# Patient Record
Sex: Male | Born: 1935 | Race: White | Hispanic: No | Marital: Married
Health system: Southern US, Community
[De-identification: ages and names within clinical notes are randomized; demographics above are authoritative.]

---

## 2003-07-03 ENCOUNTER — Other Ambulatory Visit: Payer: Self-pay

## 2004-06-17 ENCOUNTER — Other Ambulatory Visit: Payer: Self-pay

## 2004-06-17 ENCOUNTER — Inpatient Hospital Stay: Payer: Self-pay | Admitting: General Practice

## 2005-11-30 ENCOUNTER — Ambulatory Visit: Payer: Self-pay | Admitting: Gastroenterology

## 2007-06-07 ENCOUNTER — Ambulatory Visit: Payer: Self-pay | Admitting: Internal Medicine

## 2008-07-18 ENCOUNTER — Ambulatory Visit: Payer: Self-pay | Admitting: Rheumatology

## 2008-09-13 ENCOUNTER — Ambulatory Visit: Payer: Self-pay | Admitting: Internal Medicine

## 2008-09-27 ENCOUNTER — Encounter: Payer: Self-pay | Admitting: Neurology

## 2008-10-17 ENCOUNTER — Encounter: Payer: Self-pay | Admitting: Neurology

## 2009-05-16 ENCOUNTER — Emergency Department: Payer: Self-pay | Admitting: Emergency Medicine

## 2009-12-06 ENCOUNTER — Emergency Department: Payer: Self-pay | Admitting: Emergency Medicine

## 2009-12-10 ENCOUNTER — Inpatient Hospital Stay: Payer: Self-pay | Admitting: Internal Medicine

## 2009-12-14 ENCOUNTER — Ambulatory Visit: Payer: Self-pay | Admitting: Family Medicine

## 2010-02-25 ENCOUNTER — Inpatient Hospital Stay: Payer: Self-pay | Admitting: Internal Medicine

## 2010-06-09 ENCOUNTER — Emergency Department: Payer: Self-pay | Admitting: Emergency Medicine

## 2010-06-23 ENCOUNTER — Inpatient Hospital Stay: Payer: Self-pay | Admitting: Internal Medicine

## 2010-06-26 DIAGNOSIS — R7989 Other specified abnormal findings of blood chemistry: Secondary | ICD-10-CM

## 2010-07-02 DIAGNOSIS — F0150 Vascular dementia without behavioral disturbance: Secondary | ICD-10-CM

## 2010-07-02 DIAGNOSIS — G20 Parkinson's disease: Secondary | ICD-10-CM

## 2010-07-02 DIAGNOSIS — I2510 Atherosclerotic heart disease of native coronary artery without angina pectoris: Secondary | ICD-10-CM

## 2010-07-02 DIAGNOSIS — I6990 Unspecified sequelae of unspecified cerebrovascular disease: Secondary | ICD-10-CM

## 2010-07-28 DIAGNOSIS — G20 Parkinson's disease: Secondary | ICD-10-CM

## 2010-07-28 DIAGNOSIS — I6990 Unspecified sequelae of unspecified cerebrovascular disease: Secondary | ICD-10-CM

## 2010-07-28 DIAGNOSIS — F068 Other specified mental disorders due to known physiological condition: Secondary | ICD-10-CM

## 2010-07-28 DIAGNOSIS — I10 Essential (primary) hypertension: Secondary | ICD-10-CM

## 2010-08-18 DIAGNOSIS — I6990 Unspecified sequelae of unspecified cerebrovascular disease: Secondary | ICD-10-CM

## 2010-08-18 DIAGNOSIS — I10 Essential (primary) hypertension: Secondary | ICD-10-CM

## 2010-08-18 DIAGNOSIS — L570 Actinic keratosis: Secondary | ICD-10-CM

## 2010-08-18 DIAGNOSIS — F068 Other specified mental disorders due to known physiological condition: Secondary | ICD-10-CM

## 2010-08-18 DIAGNOSIS — G20 Parkinson's disease: Secondary | ICD-10-CM

## 2010-09-17 ENCOUNTER — Emergency Department: Payer: Self-pay | Admitting: Emergency Medicine

## 2010-09-18 ENCOUNTER — Telehealth: Payer: Self-pay | Admitting: *Deleted

## 2010-09-18 NOTE — Telephone Encounter (Signed)
They need to get all this information from the family I didn't oppose him leaving but thought it was not a great idea I will not do further work on this issue unless he reestablishes with me They will need to get any information they need from the family or his physician

## 2010-09-18 NOTE — Telephone Encounter (Signed)
He is no longer under my care since leaving [facility] They should request assistance from his current physician

## 2010-09-18 NOTE — Telephone Encounter (Signed)
Spoke with nurse and she states that because pt was under Dr.Letvak's care in [facility] then he must write the order. I advised her that he would need to get the order from his current physician, per nurse she doesn't know who his current physician is, and she asked me to find out for her, I advised she would need to call [facility].

## 2010-09-18 NOTE — Telephone Encounter (Signed)
Home medical supply rep states that medicare denied pt's request for a hospital bed and she is asking if you will help with the appeal to them for coverage.  She is asking if you can write a letter for the patient explaining that he does need it.  Please advise.

## 2010-09-21 NOTE — Telephone Encounter (Signed)
Left message on machine with results.

## 2010-12-01 ENCOUNTER — Ambulatory Visit: Payer: Self-pay | Admitting: Internal Medicine

## 2011-08-01 ENCOUNTER — Inpatient Hospital Stay: Payer: Self-pay | Admitting: Internal Medicine

## 2011-08-01 LAB — URINALYSIS, COMPLETE
Bilirubin,UR: NEGATIVE
Blood: NEGATIVE
Glucose,UR: NEGATIVE mg/dL (ref 0–75)
Ketone: NEGATIVE
Leukocyte Esterase: NEGATIVE
Nitrite: NEGATIVE
Protein: NEGATIVE
RBC,UR: NONE SEEN /HPF (ref 0–5)

## 2011-08-01 LAB — COMPREHENSIVE METABOLIC PANEL
Albumin: 3.7 g/dL (ref 3.4–5.0)
Alkaline Phosphatase: 53 U/L (ref 50–136)
BUN: 45 mg/dL — ABNORMAL HIGH (ref 7–18)
Bilirubin,Total: 0.5 mg/dL (ref 0.2–1.0)
Calcium, Total: 8.4 mg/dL — ABNORMAL LOW (ref 8.5–10.1)
Chloride: 109 mmol/L — ABNORMAL HIGH (ref 98–107)
Co2: 24 mmol/L (ref 21–32)
Creatinine: 1.55 mg/dL — ABNORMAL HIGH (ref 0.60–1.30)
Glucose: 148 mg/dL — ABNORMAL HIGH (ref 65–99)
Osmolality: 299 (ref 275–301)
SGOT(AST): 25 U/L (ref 15–37)
Sodium: 143 mmol/L (ref 136–145)

## 2011-08-01 LAB — CBC WITH DIFFERENTIAL/PLATELET
Basophil #: 0.1 10*3/uL (ref 0.0–0.1)
Basophil %: 0.3 %
Eosinophil #: 0 10*3/uL (ref 0.0–0.7)
HCT: 42.2 % (ref 40.0–52.0)
MCV: 100 fL (ref 80–100)
Monocyte #: 1.3 x10 3/mm — ABNORMAL HIGH (ref 0.2–1.0)
Monocyte %: 8.1 %
Neutrophil %: 90.2 %
RDW: 16.2 % — ABNORMAL HIGH (ref 11.5–14.5)
WBC: 15.7 10*3/uL — ABNORMAL HIGH (ref 3.8–10.6)

## 2011-08-01 LAB — PROTIME-INR: Prothrombin Time: 12.7 secs (ref 11.5–14.7)

## 2011-08-01 LAB — WBCS, STOOL

## 2011-08-01 LAB — CLOSTRIDIUM DIFFICILE BY PCR

## 2011-08-02 LAB — COMPREHENSIVE METABOLIC PANEL
Albumin: 2.6 g/dL — ABNORMAL LOW (ref 3.4–5.0)
Anion Gap: 11 (ref 7–16)
BUN: 33 mg/dL — ABNORMAL HIGH (ref 7–18)
Calcium, Total: 7.3 mg/dL — ABNORMAL LOW (ref 8.5–10.1)
Chloride: 120 mmol/L — ABNORMAL HIGH (ref 98–107)
Co2: 18 mmol/L — ABNORMAL LOW (ref 21–32)
Creatinine: 1.35 mg/dL — ABNORMAL HIGH (ref 0.60–1.30)
EGFR (African American): 55 — ABNORMAL LOW
Glucose: 78 mg/dL (ref 65–99)
Potassium: 3.6 mmol/L (ref 3.5–5.1)
SGPT (ALT): 13 U/L

## 2011-08-02 LAB — CBC WITH DIFFERENTIAL/PLATELET
Basophil #: 0 10*3/uL (ref 0.0–0.1)
Basophil %: 0.2 %
Eosinophil %: 0.1 %
HCT: 36 % — ABNORMAL LOW (ref 40.0–52.0)
HGB: 11.6 g/dL — ABNORMAL LOW (ref 13.0–18.0)
Lymphocyte %: 8.6 %
MCH: 32 pg (ref 26.0–34.0)
Monocyte #: 0.8 x10 3/mm (ref 0.2–1.0)
Monocyte %: 10.3 %
Neutrophil #: 6.5 10*3/uL (ref 1.4–6.5)
Neutrophil %: 80.8 %
Platelet: 186 10*3/uL (ref 150–440)
RBC: 3.63 10*6/uL — ABNORMAL LOW (ref 4.40–5.90)
RDW: 16 % — ABNORMAL HIGH (ref 11.5–14.5)

## 2011-08-03 LAB — STOOL CULTURE

## 2011-08-04 LAB — BASIC METABOLIC PANEL
BUN: 17 mg/dL (ref 7–18)
Calcium, Total: 7.5 mg/dL — ABNORMAL LOW (ref 8.5–10.1)
Chloride: 110 mmol/L — ABNORMAL HIGH (ref 98–107)
EGFR (African American): >60
EGFR (Non-African Amer.): >60
Glucose: 78 mg/dL (ref 65–99)
Osmolality: 280 (ref 275–301)
Potassium: 3.2 mmol/L — ABNORMAL LOW (ref 3.5–5.1)
Sodium: 140 mmol/L (ref 136–145)

## 2011-08-11 ENCOUNTER — Other Ambulatory Visit: Payer: Medicare Other

## 2011-09-18 ENCOUNTER — Ambulatory Visit: Payer: Self-pay | Admitting: Internal Medicine

## 2011-10-05 ENCOUNTER — Inpatient Hospital Stay: Payer: Self-pay | Admitting: Internal Medicine

## 2011-10-05 LAB — CBC WITH DIFFERENTIAL/PLATELET
Basophil #: 0 10*3/uL (ref 0.0–0.1)
Eosinophil #: 0 10*3/uL (ref 0.0–0.7)
HGB: 12.9 g/dL — ABNORMAL LOW (ref 13.0–18.0)
Lymphocyte #: 0.8 10*3/uL — ABNORMAL LOW (ref 1.0–3.6)
MCH: 31.6 pg (ref 26.0–34.0)
MCHC: 31.8 g/dL — ABNORMAL LOW (ref 32.0–36.0)
MCV: 99 fL (ref 80–100)
Neutrophil #: 8.5 10*3/uL — ABNORMAL HIGH (ref 1.4–6.5)
Neutrophil %: 85.3 %
Platelet: 231 10*3/uL (ref 150–440)
RBC: 4.09 10*6/uL — ABNORMAL LOW (ref 4.40–5.90)
RDW: 15.3 % — ABNORMAL HIGH (ref 11.5–14.5)
WBC: 10 10*3/uL (ref 3.8–10.6)

## 2011-10-05 LAB — COMPREHENSIVE METABOLIC PANEL
Albumin: 3.6 g/dL (ref 3.4–5.0)
Alkaline Phosphatase: 59 U/L (ref 50–136)
Anion Gap: 10 (ref 7–16)
Bilirubin,Total: 0.7 mg/dL (ref 0.2–1.0)
Calcium, Total: 8.9 mg/dL (ref 8.5–10.1)
Creatinine: 1.36 mg/dL — ABNORMAL HIGH (ref 0.60–1.30)
EGFR (African American): 55 — ABNORMAL LOW
Glucose: 114 mg/dL — ABNORMAL HIGH (ref 65–99)
Osmolality: 283 (ref 275–301)
Potassium: 4.2 mmol/L (ref 3.5–5.1)
SGOT(AST): 27 U/L (ref 15–37)
Sodium: 138 mmol/L (ref 136–145)
Total Protein: 7.4 g/dL (ref 6.4–8.2)

## 2011-10-05 LAB — PROTIME-INR: INR: 0.9

## 2011-10-05 LAB — TSH: Thyroid Stimulating Horm: 2.07 u[IU]/mL

## 2011-10-05 LAB — TROPONIN I: Troponin-I: <0.02 ng/mL

## 2011-10-06 LAB — CBC WITH DIFFERENTIAL/PLATELET
Basophil %: 0.5 %
Eosinophil #: 0.1 10*3/uL (ref 0.0–0.7)
HCT: 36.5 % — ABNORMAL LOW (ref 40.0–52.0)
HGB: 11.9 g/dL — ABNORMAL LOW (ref 13.0–18.0)
Lymphocyte #: 1.5 10*3/uL (ref 1.0–3.6)
Lymphocyte %: 17.7 %
MCH: 31.8 pg (ref 26.0–34.0)
MCHC: 32.5 g/dL (ref 32.0–36.0)
MCV: 98 fL (ref 80–100)
Monocyte #: 1.1 x10 3/mm — ABNORMAL HIGH (ref 0.2–1.0)
Monocyte %: 12 %
Neutrophil %: 69.1 %
Platelet: 214 10*3/uL (ref 150–440)
RBC: 3.73 10*6/uL — ABNORMAL LOW (ref 4.40–5.90)
RDW: 14.8 % — ABNORMAL HIGH (ref 11.5–14.5)

## 2011-10-06 LAB — BASIC METABOLIC PANEL
BUN: 25 mg/dL — ABNORMAL HIGH (ref 7–18)
Co2: 24 mmol/L (ref 21–32)
Creatinine: 1.16 mg/dL (ref 0.60–1.30)
EGFR (African American): >60
EGFR (Non-African Amer.): 58 — ABNORMAL LOW
Potassium: 3.7 mmol/L (ref 3.5–5.1)

## 2011-10-06 LAB — LIPID PANEL
Cholesterol: 179 mg/dL (ref 0–200)
HDL Cholesterol: 74 mg/dL — ABNORMAL HIGH (ref 40–60)
Ldl Cholesterol, Calc: 80 mg/dL (ref 0–100)
Triglycerides: 127 mg/dL (ref 0–200)

## 2011-10-06 LAB — TROPONIN I: Troponin-I: 0.02 ng/mL

## 2011-10-06 LAB — FOLATE: Folic Acid: 19 ng/mL (ref 3.1–100.0)

## 2011-10-07 LAB — BASIC METABOLIC PANEL
Anion Gap: 10 (ref 7–16)
BUN: 22 mg/dL — ABNORMAL HIGH (ref 7–18)
Calcium, Total: 8.8 mg/dL (ref 8.5–10.1)
Co2: 25 mmol/L (ref 21–32)
Creatinine: 1.15 mg/dL (ref 0.60–1.30)
EGFR (African American): >60
Glucose: 73 mg/dL (ref 65–99)
Osmolality: 283 (ref 275–301)
Sodium: 141 mmol/L (ref 136–145)

## 2011-10-07 LAB — CBC WITH DIFFERENTIAL/PLATELET
Basophil #: 0 10*3/uL (ref 0.0–0.1)
Eosinophil %: 0.5 %
HCT: 38.2 % — ABNORMAL LOW (ref 40.0–52.0)
HGB: 12.5 g/dL — ABNORMAL LOW (ref 13.0–18.0)
Lymphocyte #: 1.1 10*3/uL (ref 1.0–3.6)
Lymphocyte %: 12.7 %
MCV: 98 fL (ref 80–100)
Neutrophil #: 6.5 10*3/uL (ref 1.4–6.5)
Neutrophil %: 75.3 %
Platelet: 210 10*3/uL (ref 150–440)
RBC: 3.88 10*6/uL — ABNORMAL LOW (ref 4.40–5.90)
RDW: 15.2 % — ABNORMAL HIGH (ref 11.5–14.5)
WBC: 8.6 10*3/uL (ref 3.8–10.6)

## 2011-10-08 LAB — CBC WITH DIFFERENTIAL/PLATELET
Basophil #: 0 10*3/uL (ref 0.0–0.1)
Basophil %: 0.5 %
Eosinophil #: 0 10*3/uL (ref 0.0–0.7)
Eosinophil %: 0.4 %
HCT: 36.9 % — ABNORMAL LOW (ref 40.0–52.0)
HGB: 12.3 g/dL — ABNORMAL LOW (ref 13.0–18.0)
Lymphocyte #: 1.3 10*3/uL (ref 1.0–3.6)
MCH: 32.8 pg (ref 26.0–34.0)
MCHC: 33.4 g/dL (ref 32.0–36.0)
Monocyte #: 1 x10 3/mm (ref 0.2–1.0)
Monocyte %: 11.8 %
Neutrophil #: 6.4 10*3/uL (ref 1.4–6.5)
Neutrophil %: 72.4 %
Platelet: 210 10*3/uL (ref 150–440)
RBC: 3.75 10*6/uL — ABNORMAL LOW (ref 4.40–5.90)
RDW: 15.4 % — ABNORMAL HIGH (ref 11.5–14.5)

## 2011-10-08 LAB — BASIC METABOLIC PANEL
Anion Gap: 9 (ref 7–16)
BUN: 24 mg/dL — ABNORMAL HIGH (ref 7–18)
Calcium, Total: 8.6 mg/dL (ref 8.5–10.1)
Chloride: 107 mmol/L (ref 98–107)
Co2: 24 mmol/L (ref 21–32)
EGFR (Non-African Amer.): 46 — ABNORMAL LOW
Glucose: 86 mg/dL (ref 65–99)
Potassium: 3.8 mmol/L (ref 3.5–5.1)
Sodium: 140 mmol/L (ref 136–145)

## 2011-10-10 LAB — CBC WITH DIFFERENTIAL/PLATELET
Basophil #: 0.1 10*3/uL (ref 0.0–0.1)
Eosinophil #: 0.1 10*3/uL (ref 0.0–0.7)
HCT: 39.6 % — ABNORMAL LOW (ref 40.0–52.0)
HGB: 12.8 g/dL — ABNORMAL LOW (ref 13.0–18.0)
Lymphocyte #: 1.9 10*3/uL (ref 1.0–3.6)
MCH: 31.8 pg (ref 26.0–34.0)
MCHC: 32.2 g/dL (ref 32.0–36.0)
Monocyte #: 1.6 x10 3/mm — ABNORMAL HIGH (ref 0.2–1.0)
Monocyte %: 11.3 %
RBC: 4.01 10*6/uL — ABNORMAL LOW (ref 4.40–5.90)
RDW: 15.5 % — ABNORMAL HIGH (ref 11.5–14.5)
WBC: 14.5 10*3/uL — ABNORMAL HIGH (ref 3.8–10.6)

## 2011-10-10 LAB — BASIC METABOLIC PANEL
BUN: 31 mg/dL — ABNORMAL HIGH (ref 7–18)
Creatinine: 1.51 mg/dL — ABNORMAL HIGH (ref 0.60–1.30)
EGFR (African American): 48 — ABNORMAL LOW
Glucose: 81 mg/dL (ref 65–99)
Osmolality: 287 (ref 275–301)

## 2011-10-11 LAB — CBC WITH DIFFERENTIAL/PLATELET
Basophil #: 0 10*3/uL (ref 0.0–0.1)
Basophil %: 0.3 %
Eosinophil #: 0.1 10*3/uL (ref 0.0–0.7)
Eosinophil %: 1 %
HCT: 33.2 % — ABNORMAL LOW (ref 40.0–52.0)
HGB: 10.9 g/dL — ABNORMAL LOW (ref 13.0–18.0)
Lymphocyte %: 10.9 %
MCH: 32.3 pg (ref 26.0–34.0)
Monocyte #: 1.3 x10 3/mm — ABNORMAL HIGH (ref 0.2–1.0)
Neutrophil #: 9.1 10*3/uL — ABNORMAL HIGH (ref 1.4–6.5)
RBC: 3.37 10*6/uL — ABNORMAL LOW (ref 4.40–5.90)
RDW: 15.7 % — ABNORMAL HIGH (ref 11.5–14.5)
WBC: 11.8 10*3/uL — ABNORMAL HIGH (ref 3.8–10.6)

## 2011-10-11 LAB — BASIC METABOLIC PANEL
Anion Gap: 9 (ref 7–16)
BUN: 26 mg/dL — ABNORMAL HIGH (ref 7–18)
Co2: 22 mmol/L (ref 21–32)
Creatinine: 1.16 mg/dL (ref 0.60–1.30)
Glucose: 88 mg/dL (ref 65–99)

## 2011-10-12 LAB — CBC WITH DIFFERENTIAL/PLATELET
Basophil #: 0 10*3/uL (ref 0.0–0.1)
Basophil %: 0.4 %
HCT: 36.8 % — ABNORMAL LOW (ref 40.0–52.0)
HGB: 12.2 g/dL — ABNORMAL LOW (ref 13.0–18.0)
Lymphocyte #: 1.4 10*3/uL (ref 1.0–3.6)
Lymphocyte %: 13.9 %
Monocyte #: 1.2 x10 3/mm — ABNORMAL HIGH (ref 0.2–1.0)
Monocyte %: 12.4 %
Neutrophil %: 72.1 %
RBC: 3.75 10*6/uL — ABNORMAL LOW (ref 4.40–5.90)
RDW: 15.5 % — ABNORMAL HIGH (ref 11.5–14.5)

## 2011-10-12 LAB — BASIC METABOLIC PANEL
BUN: 23 mg/dL — ABNORMAL HIGH (ref 7–18)
Co2: 25 mmol/L (ref 21–32)
EGFR (African American): >60
Glucose: 79 mg/dL (ref 65–99)
Potassium: 3.7 mmol/L (ref 3.5–5.1)
Sodium: 141 mmol/L (ref 136–145)

## 2011-10-18 ENCOUNTER — Ambulatory Visit: Payer: Self-pay | Admitting: Internal Medicine

## 2011-11-20 ENCOUNTER — Emergency Department: Payer: Self-pay | Admitting: Emergency Medicine

## 2011-11-20 LAB — CBC WITH DIFFERENTIAL/PLATELET
Basophil #: 0 10*3/uL (ref 0.0–0.1)
Basophil %: 0.2 %
HCT: 30.4 % — ABNORMAL LOW (ref 40.0–52.0)
HGB: 10.3 g/dL — ABNORMAL LOW (ref 13.0–18.0)
Lymphocyte %: 6.9 %
MCHC: 33.7 g/dL (ref 32.0–36.0)
Monocyte %: 7.5 %
Neutrophil #: 10.5 10*3/uL — ABNORMAL HIGH (ref 1.4–6.5)
Neutrophil %: 85.1 %
Platelet: 200 10*3/uL (ref 150–440)
RBC: 3.09 10*6/uL — ABNORMAL LOW (ref 4.40–5.90)
WBC: 12.4 10*3/uL — ABNORMAL HIGH (ref 3.8–10.6)

## 2011-11-20 LAB — URINALYSIS, COMPLETE
Bilirubin,UR: NEGATIVE
Blood: NEGATIVE
Glucose,UR: NEGATIVE mg/dL (ref 0–75)
Ketone: NEGATIVE
Nitrite: NEGATIVE
Protein: 30
RBC,UR: 8 /HPF (ref 0–5)
Specific Gravity: 1.02 (ref 1.003–1.030)
WBC UR: 424 /HPF (ref 0–5)

## 2011-11-20 LAB — COMPREHENSIVE METABOLIC PANEL
Albumin: 2.5 g/dL — ABNORMAL LOW (ref 3.4–5.0)
Alkaline Phosphatase: 64 U/L (ref 50–136)
Anion Gap: 11 (ref 7–16)
BUN: 41 mg/dL — ABNORMAL HIGH (ref 7–18)
Calcium, Total: 8.1 mg/dL — ABNORMAL LOW (ref 8.5–10.1)
EGFR (Non-African Amer.): 45 — ABNORMAL LOW
Glucose: 110 mg/dL — ABNORMAL HIGH (ref 65–99)
SGOT(AST): 19 U/L (ref 15–37)
SGPT (ALT): 8 U/L — ABNORMAL LOW (ref 12–78)
Total Protein: 6 g/dL — ABNORMAL LOW (ref 6.4–8.2)

## 2011-11-20 LAB — SEDIMENTATION RATE: Erythrocyte Sed Rate: 61 mm/hr — ABNORMAL HIGH (ref 0–20)

## 2011-11-21 LAB — URINE CULTURE

## 2012-02-10 ENCOUNTER — Inpatient Hospital Stay: Payer: Self-pay

## 2012-02-10 LAB — CBC WITH DIFFERENTIAL/PLATELET
Eosinophil %: 0.3 %
HCT: 32.1 % — ABNORMAL LOW (ref 40.0–52.0)
HGB: 11 g/dL — ABNORMAL LOW (ref 13.0–18.0)
Lymphocyte %: 2.5 %
MCH: 33.2 pg (ref 26.0–34.0)
MCV: 97 fL (ref 80–100)
Monocyte #: 0.8 x10 3/mm (ref 0.2–1.0)
Monocyte %: 8.1 %
Neutrophil #: 8.7 10*3/uL — ABNORMAL HIGH (ref 1.4–6.5)
Neutrophil %: 88.8 %
Platelet: 213 10*3/uL (ref 150–440)
RBC: 3.31 10*6/uL — ABNORMAL LOW (ref 4.40–5.90)
WBC: 9.8 10*3/uL (ref 3.8–10.6)

## 2012-02-10 LAB — URINALYSIS, COMPLETE
Glucose,UR: NEGATIVE mg/dL (ref 0–75)
Nitrite: NEGATIVE
Ph: 5 (ref 4.5–8.0)
Protein: NEGATIVE
RBC,UR: 2 /HPF (ref 0–5)
WBC UR: 80 /HPF (ref 0–5)

## 2012-02-10 LAB — TROPONIN I: Troponin-I: 0.02 ng/mL

## 2012-02-10 LAB — COMPREHENSIVE METABOLIC PANEL
Alkaline Phosphatase: 68 U/L (ref 50–136)
Anion Gap: 10 (ref 7–16)
Chloride: 111 mmol/L — ABNORMAL HIGH (ref 98–107)
Creatinine: 1.33 mg/dL — ABNORMAL HIGH (ref 0.60–1.30)
EGFR (African American): 56 — ABNORMAL LOW
EGFR (Non-African Amer.): 49 — ABNORMAL LOW
Potassium: 4.2 mmol/L (ref 3.5–5.1)
SGOT(AST): 14 U/L — ABNORMAL LOW (ref 15–37)
SGPT (ALT): 17 U/L (ref 12–78)

## 2012-02-11 LAB — BASIC METABOLIC PANEL
Anion Gap: 11 (ref 7–16)
Calcium, Total: 7.9 mg/dL — ABNORMAL LOW (ref 8.5–10.1)
Chloride: 111 mmol/L — ABNORMAL HIGH (ref 98–107)
Co2: 20 mmol/L — ABNORMAL LOW (ref 21–32)
EGFR (African American): 50 — ABNORMAL LOW
EGFR (Non-African Amer.): 43 — ABNORMAL LOW
Glucose: 85 mg/dL (ref 65–99)
Osmolality: 290 (ref 275–301)
Potassium: 4 mmol/L (ref 3.5–5.1)

## 2012-02-11 LAB — CBC WITH DIFFERENTIAL/PLATELET
Basophil #: 0 10*3/uL (ref 0.0–0.1)
Eosinophil #: 0.1 10*3/uL (ref 0.0–0.7)
Eosinophil %: 0.9 %
HGB: 10.5 g/dL — ABNORMAL LOW (ref 13.0–18.0)
Lymphocyte #: 1 10*3/uL (ref 1.0–3.6)
Lymphocyte %: 6.6 %
MCH: 32.8 pg (ref 26.0–34.0)
MCHC: 34 g/dL (ref 32.0–36.0)
Monocyte %: 12.5 %
Neutrophil #: 11.9 10*3/uL — ABNORMAL HIGH (ref 1.4–6.5)
Neutrophil %: 79.7 %
RBC: 3.21 10*6/uL — ABNORMAL LOW (ref 4.40–5.90)
WBC: 14.9 10*3/uL — ABNORMAL HIGH (ref 3.8–10.6)

## 2012-02-11 LAB — COMPREHENSIVE METABOLIC PANEL
Albumin: 2.2 g/dL — ABNORMAL LOW (ref 3.4–5.0)
Bilirubin,Total: 0.3 mg/dL (ref 0.2–1.0)
SGOT(AST): 21 U/L (ref 15–37)
SGPT (ALT): 10 U/L — ABNORMAL LOW (ref 12–78)
Total Protein: 5.3 g/dL — ABNORMAL LOW (ref 6.4–8.2)

## 2012-02-12 LAB — CBC WITH DIFFERENTIAL/PLATELET
Basophil #: 0 10*3/uL (ref 0.0–0.1)
Lymphocyte #: 0.5 10*3/uL — ABNORMAL LOW (ref 1.0–3.6)
Lymphocyte %: 3.9 %
MCHC: 33.2 g/dL (ref 32.0–36.0)
MCV: 95 fL (ref 80–100)
Monocyte #: 1 x10 3/mm (ref 0.2–1.0)
Monocyte %: 8.4 %
Neutrophil #: 10.7 10*3/uL — ABNORMAL HIGH (ref 1.4–6.5)
Platelet: 227 10*3/uL (ref 150–440)
RBC: 3.34 10*6/uL — ABNORMAL LOW (ref 4.40–5.90)
RDW: 14.9 % — ABNORMAL HIGH (ref 11.5–14.5)
WBC: 12.2 10*3/uL — ABNORMAL HIGH (ref 3.8–10.6)

## 2012-02-12 LAB — COMPREHENSIVE METABOLIC PANEL
Alkaline Phosphatase: 50 U/L (ref 50–136)
Anion Gap: 11 (ref 7–16)
BUN: 30 mg/dL — ABNORMAL HIGH (ref 7–18)
Bilirubin,Total: 0.4 mg/dL (ref 0.2–1.0)
Calcium, Total: 8.4 mg/dL — ABNORMAL LOW (ref 8.5–10.1)
Chloride: 111 mmol/L — ABNORMAL HIGH (ref 98–107)
Co2: 20 mmol/L — ABNORMAL LOW (ref 21–32)
Creatinine: 1.49 mg/dL — ABNORMAL HIGH (ref 0.60–1.30)
EGFR (African American): 49 — ABNORMAL LOW
EGFR (Non-African Amer.): 42 — ABNORMAL LOW
Osmolality: 290 (ref 275–301)
Sodium: 142 mmol/L (ref 136–145)
Total Protein: 5.9 g/dL — ABNORMAL LOW (ref 6.4–8.2)

## 2012-02-12 LAB — CULTURE, BLOOD (SINGLE)

## 2012-02-13 LAB — CBC WITH DIFFERENTIAL/PLATELET
Eosinophil: 2 %
MCH: 30.7 pg (ref 26.0–34.0)
MCV: 96 fL (ref 80–100)
Metamyelocyte: 3 %
Myelocyte: 2 %
Platelet: 266 10*3/uL (ref 150–440)
RDW: 15.1 % — ABNORMAL HIGH (ref 11.5–14.5)
WBC: 11.7 10*3/uL — ABNORMAL HIGH (ref 3.8–10.6)

## 2012-02-13 LAB — BASIC METABOLIC PANEL
Calcium, Total: 8.3 mg/dL — ABNORMAL LOW (ref 8.5–10.1)
Chloride: 111 mmol/L — ABNORMAL HIGH (ref 98–107)
Co2: 20 mmol/L — ABNORMAL LOW (ref 21–32)
EGFR (Non-African Amer.): 53 — ABNORMAL LOW
Glucose: 62 mg/dL — ABNORMAL LOW (ref 65–99)
Osmolality: 287 (ref 275–301)
Potassium: 3.7 mmol/L (ref 3.5–5.1)

## 2012-02-14 LAB — CBC WITH DIFFERENTIAL/PLATELET
Comment - H1-Com4: NORMAL
HCT: 31.1 % — ABNORMAL LOW (ref 40.0–52.0)
Lymphocytes: 13 %
MCH: 31.4 pg (ref 26.0–34.0)
MCV: 95 fL (ref 80–100)
Metamyelocyte: 3 %
Monocytes: 2 %
Myelocyte: 4 %
RBC: 3.27 10*6/uL — ABNORMAL LOW (ref 4.40–5.90)
RDW: 14.5 % (ref 11.5–14.5)
Segmented Neutrophils: 69 %
WBC: 7.5 10*3/uL (ref 3.8–10.6)

## 2012-02-14 LAB — BASIC METABOLIC PANEL
Anion Gap: 11 (ref 7–16)
BUN: 24 mg/dL — ABNORMAL HIGH (ref 7–18)
Calcium, Total: 8 mg/dL — ABNORMAL LOW (ref 8.5–10.1)
Creatinine: 1.19 mg/dL (ref 0.60–1.30)
EGFR (African American): >60

## 2012-02-15 LAB — CULTURE, BLOOD (SINGLE)

## 2012-06-08 ENCOUNTER — Emergency Department: Payer: Self-pay | Admitting: Emergency Medicine

## 2012-06-08 LAB — APTT: Activated PTT: 31.5 secs (ref 23.6–35.9)

## 2012-06-08 LAB — CBC WITH DIFFERENTIAL/PLATELET
Basophil %: 0.7 %
Eosinophil #: 0.2 10*3/uL (ref 0.0–0.7)
Eosinophil %: 1.9 %
HCT: 37.2 % — ABNORMAL LOW (ref 40.0–52.0)
HGB: 11.6 g/dL — ABNORMAL LOW (ref 13.0–18.0)
Lymphocyte %: 20.2 %
MCH: 29.7 pg (ref 26.0–34.0)
MCHC: 31.1 g/dL — ABNORMAL LOW (ref 32.0–36.0)
MCV: 96 fL (ref 80–100)
Monocyte #: 1.4 x10 3/mm — ABNORMAL HIGH (ref 0.2–1.0)
Neutrophil #: 7.1 10*3/uL — ABNORMAL HIGH (ref 1.4–6.5)
Neutrophil %: 64.7 %
Platelet: 276 10*3/uL (ref 150–440)
RBC: 3.89 10*6/uL — ABNORMAL LOW (ref 4.40–5.90)
RDW: 16.6 % — ABNORMAL HIGH (ref 11.5–14.5)
WBC: 10.9 10*3/uL — ABNORMAL HIGH (ref 3.8–10.6)

## 2012-06-08 LAB — PROTIME-INR: Prothrombin Time: 13.4 secs (ref 11.5–14.7)

## 2013-08-01 IMAGING — CT CT HEAD WITHOUT CONTRAST
2 series · 15 of 30 positions shown, 19 images · non-contrast
Comparison: none

REASON FOR EXAM: cva sx
COMMENTS:

PROCEDURE:     CT  - CT HEAD WITHOUT CONTRAST  - October 05, 2011  [DATE]
RESULT:     Comparison:  None
TECHNIQUE: Multiple axial images from the foramen magnum to the vertex were
obtained without IV contrast.

[Series 2: without · axial · non-contrast · 0.45mm/px · z∈[+265,+400]mm · 13 of 33 slices shown, 17 images]
[im 3/33  brain]
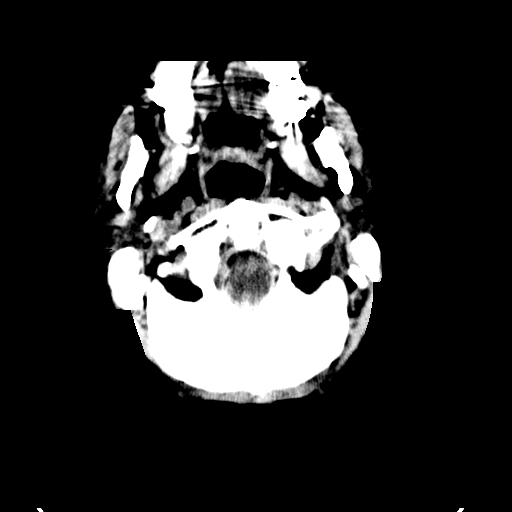
[im 3/33  bone]
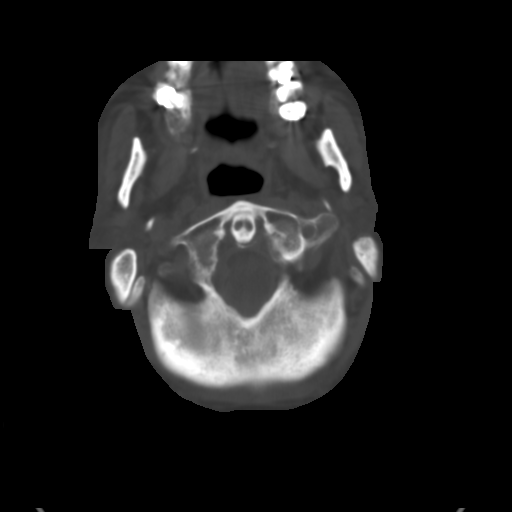
[im 5/33  brain]
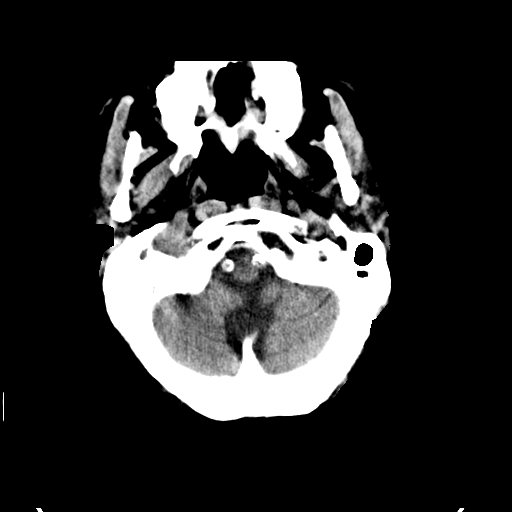
[im 7/33  brain]
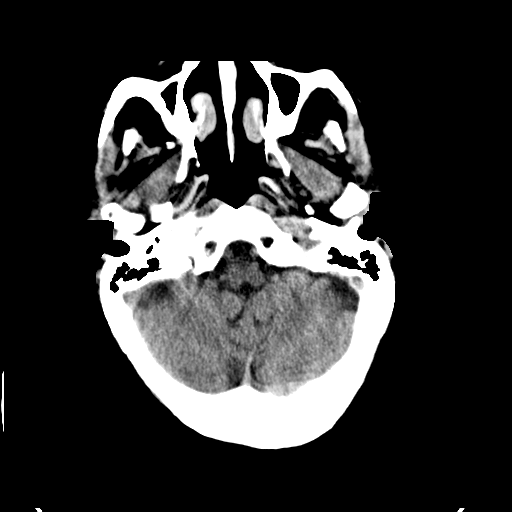
[im 10/33  brain]
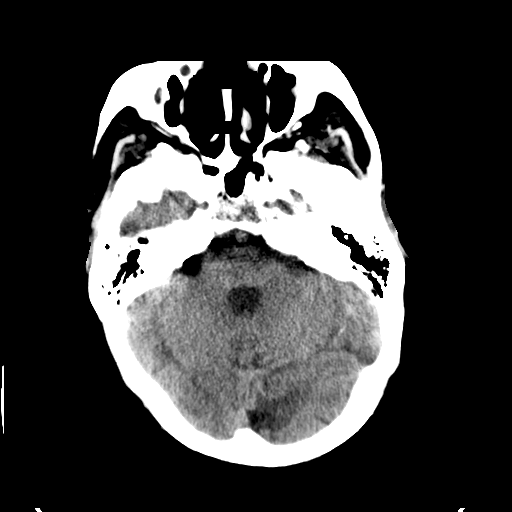
[im 12/33  brain]
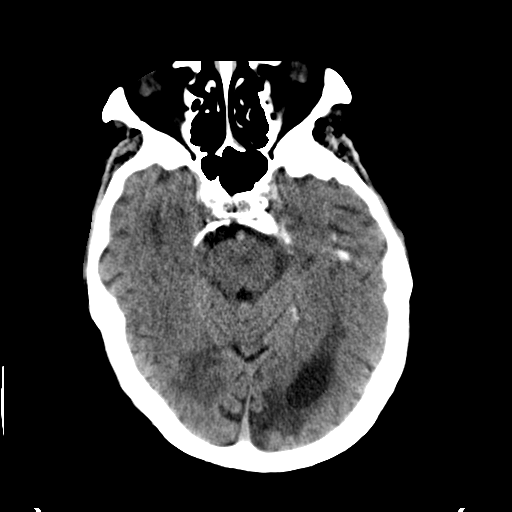
[im 12/33  bone]
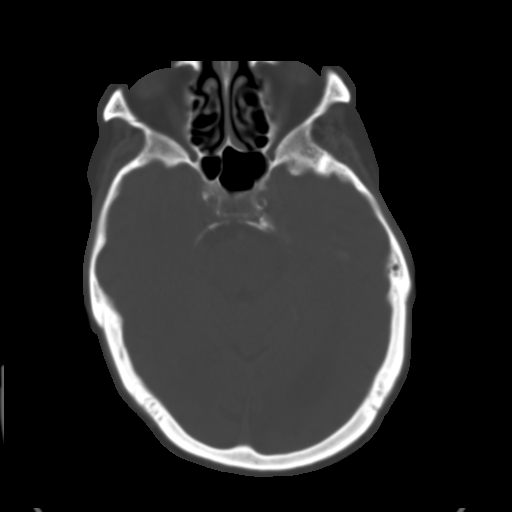
[im 14/33  brain]
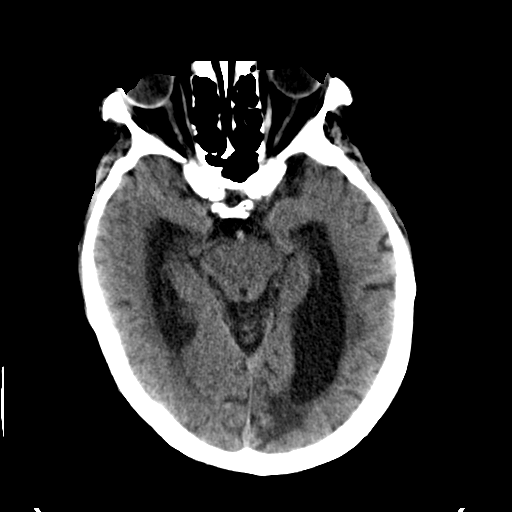
[im 17/33  brain]
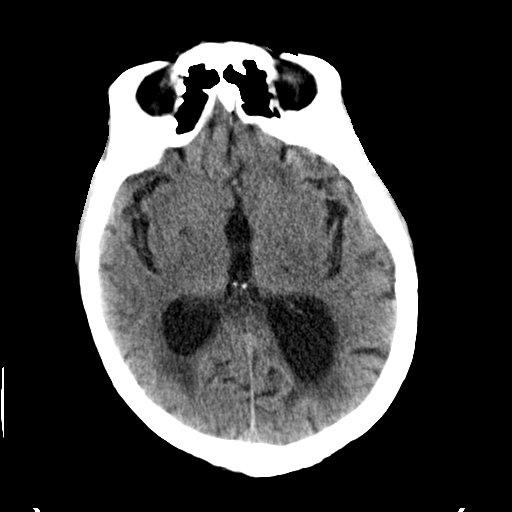
[im 19/33  brain]
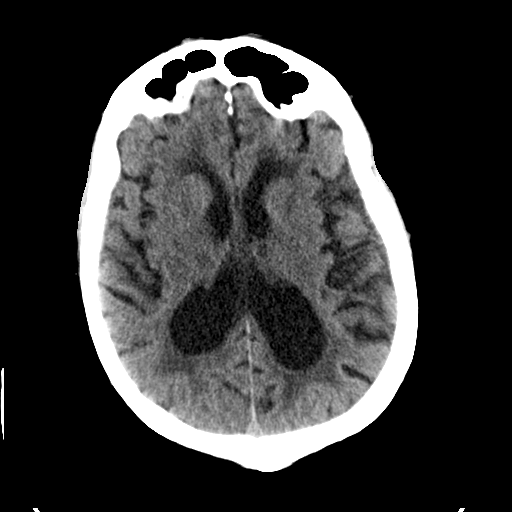
[im 21/33  brain]
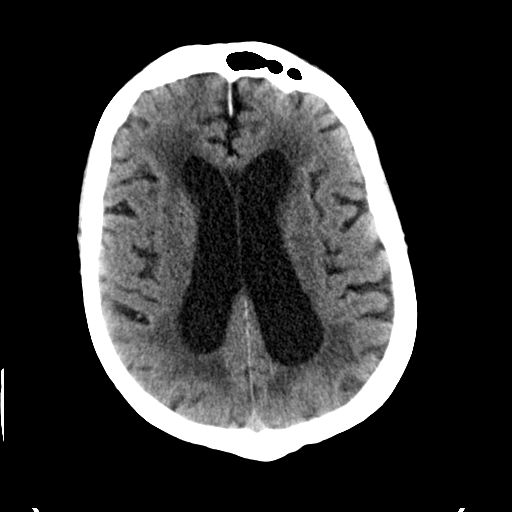
[im 21/33  bone]
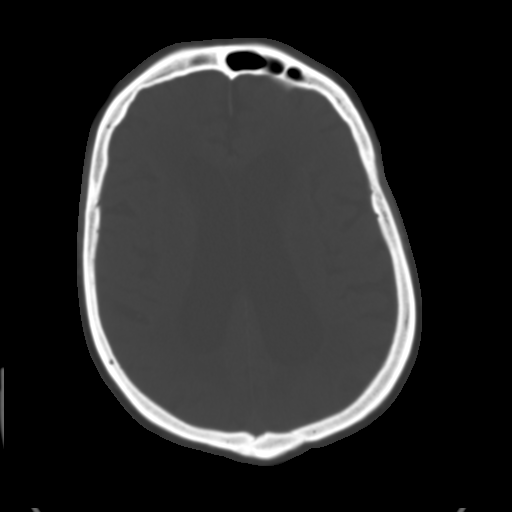
[im 23/33  brain]
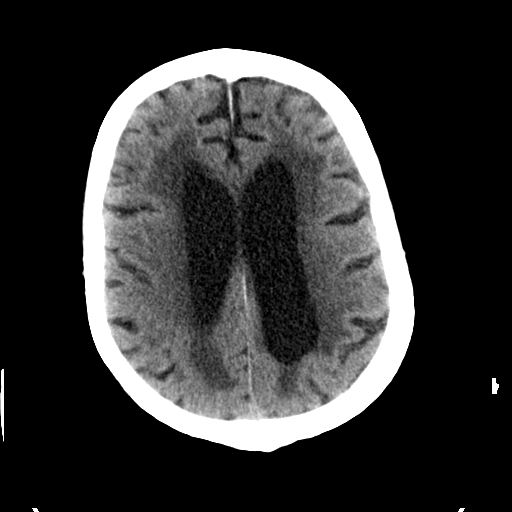
[im 26/33  brain]
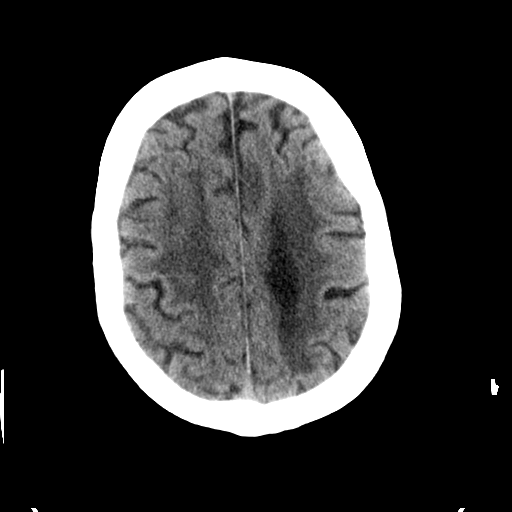
[im 28/33  brain]
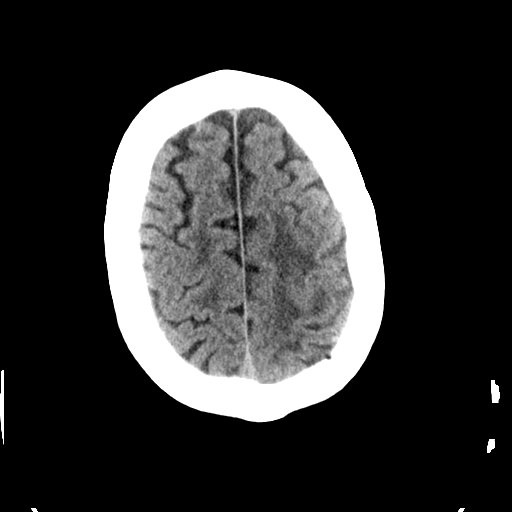
[im 30/33  brain]
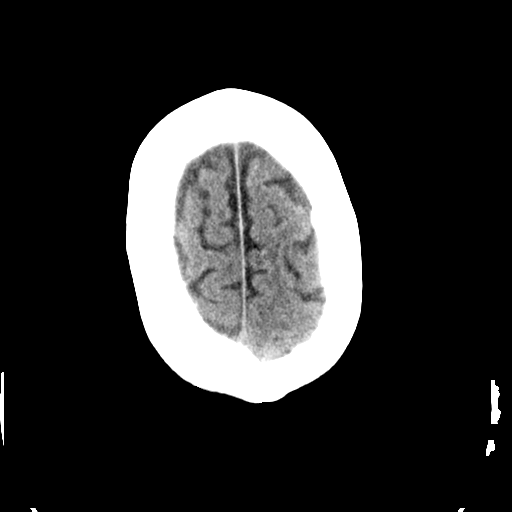
[im 30/33  bone]
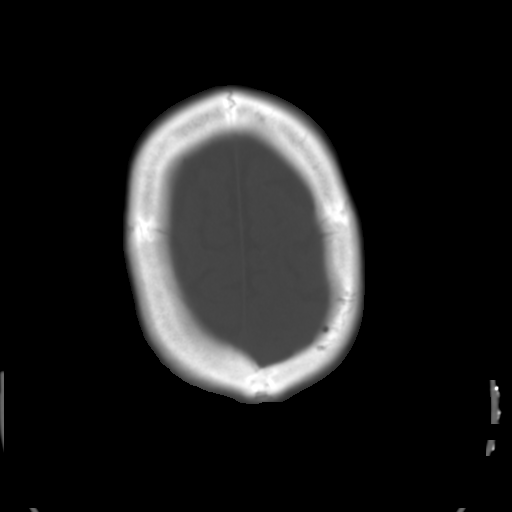

[Series 3: bone · axial · 0.45mm/px · z∈[+265,+290]mm · 2 of 35 slices shown]
[im 3/35  bone]
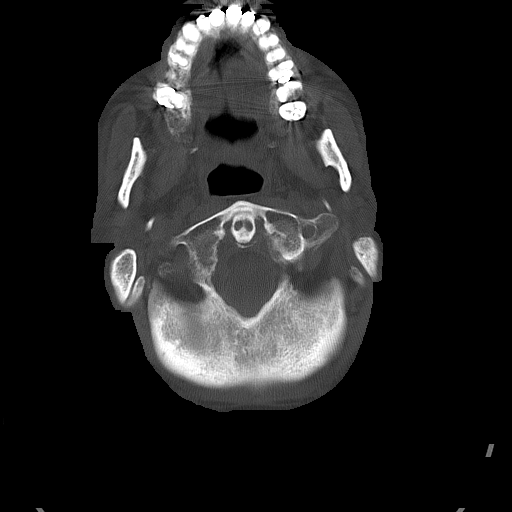
[im 8/35  bone]
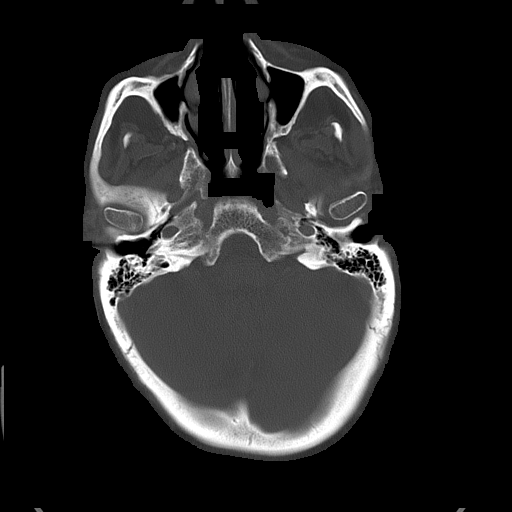

[15 of 30 positions shown; findings below may reference images not displayed]

FINDINGS: There is no evidence of mass effect, midline shift, or extra-axial fluid
collections.  There is no evidence of a space-occupying lesion or
intracranial hemorrhage. There is an old left right occipital infarct with
encephalomalacia. There is a subacute right parietal infarct. There is
generalized cerebral atrophy. There is periventricular white matter low
attenuation likely secondary to microangiopathy.

The ventricles and sulci are appropriate for the patient's age. The basal
cisterns are patent.

Visualized portions of the orbits are unremarkable. The visualized portions
of the paranasal sinuses and mastoid air cells are unremarkable.
Cerebrovascular atherosclerotic calcifications are noted.

The osseous structures are unremarkable.
IMPRESSION: Subacute nonhemorrhagic right parietal lobe infarct. CT can underestimate
ischemia in the first 24 hours after the event. If there is clinical concern
for an acute infarct, a followup MRI or repeat CT scan in 24 hours may
provide additional information.

[REDACTED]

## 2013-08-04 IMAGING — CT CT HEAD WITHOUT CONTRAST
1 of 2 series · 15 of 30 positions shown, 19 images · non-contrast
Comparison: none

REASON FOR EXAM: stroke like symptoms in L PCA region with known R PCA
infarct
COMMENTS:

[Series 2: soft tissue · axial · 0.43mm/px · z∈[+341,+491]mm · 15 of 34 slices shown, 19 images]
[im 2/34  brain]
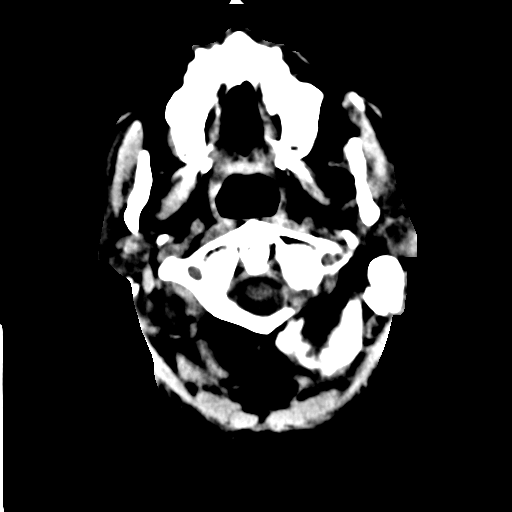
[im 2/34  bone]
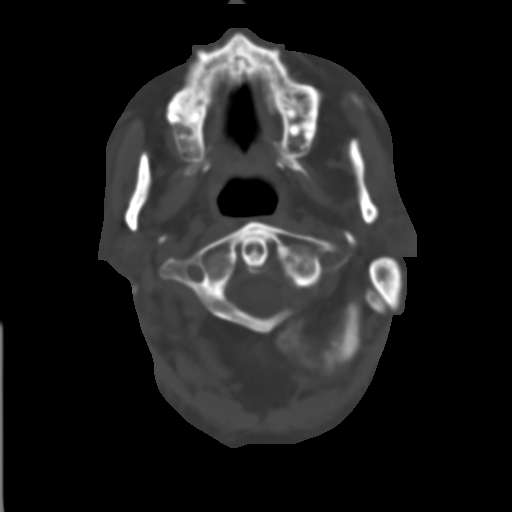
[im 5/34  brain]
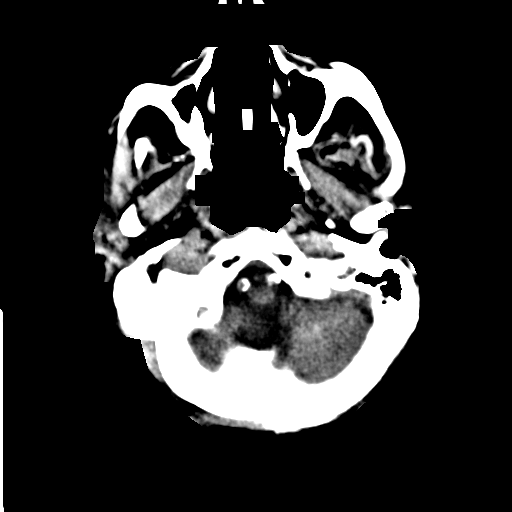
[im 7/34  brain]
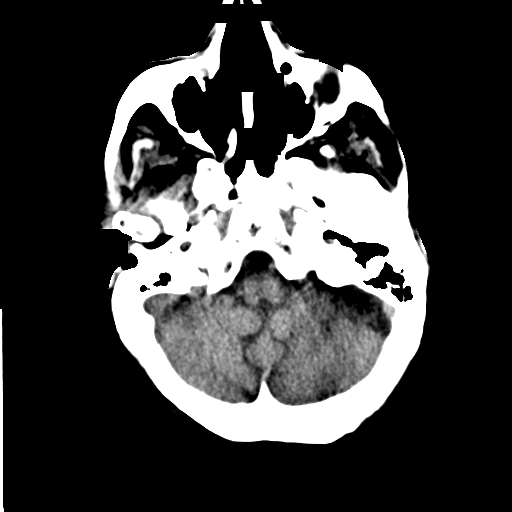
[im 8/34  brain]
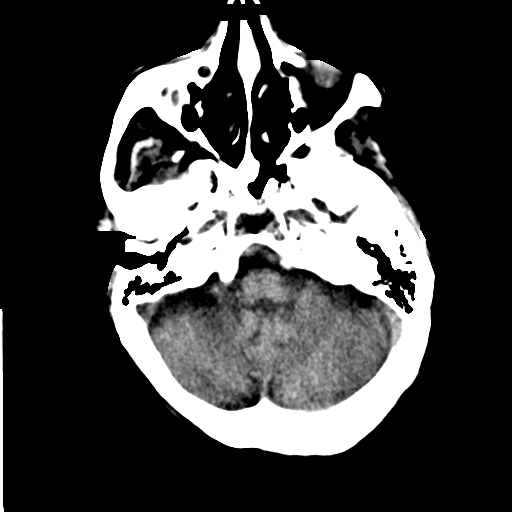
[im 12/34  brain]
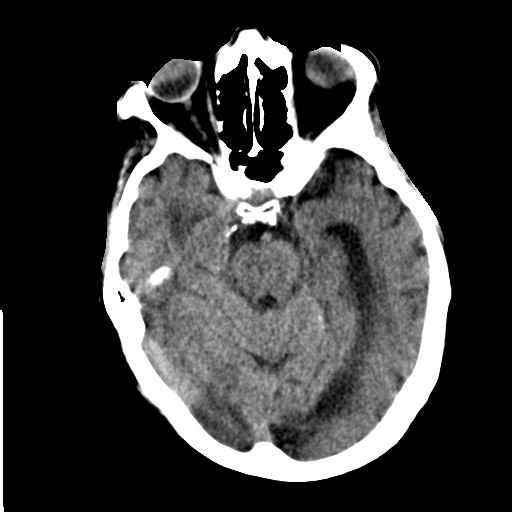
[im 12/34  bone]
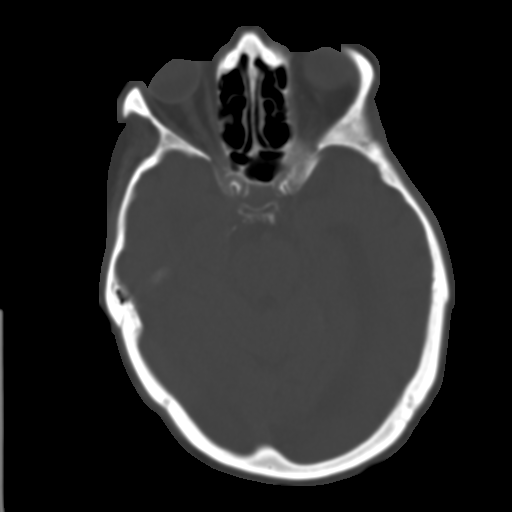
[im 13/34  brain]
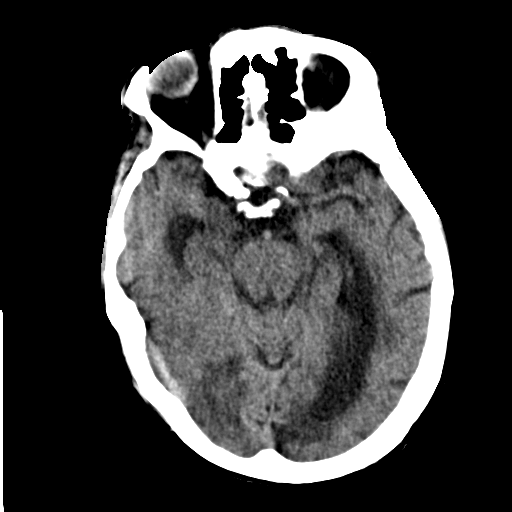
[im 15/34  brain]
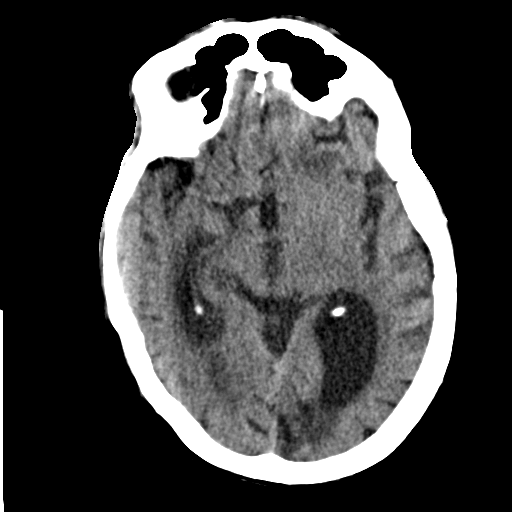
[im 18/34  brain]
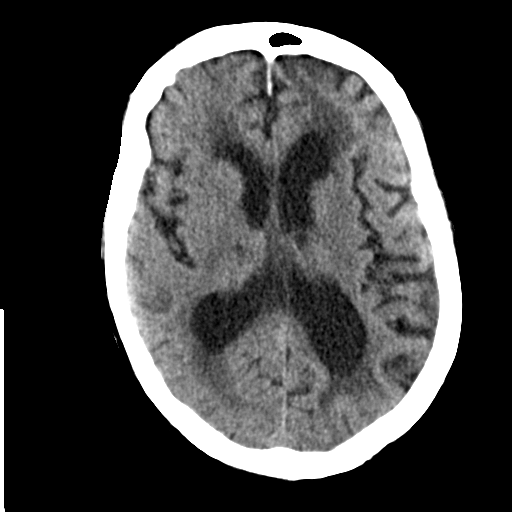
[im 19/34  brain]
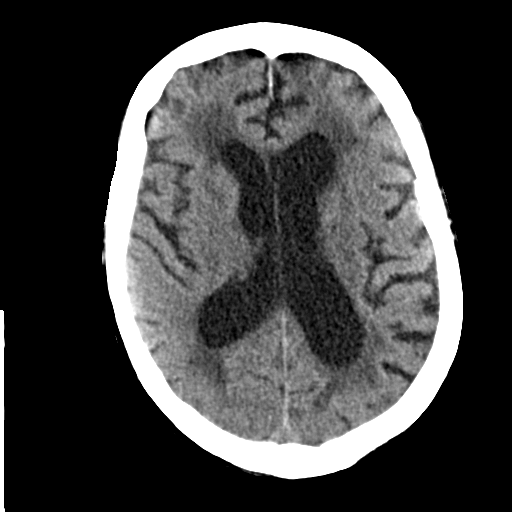
[im 19/34  bone]
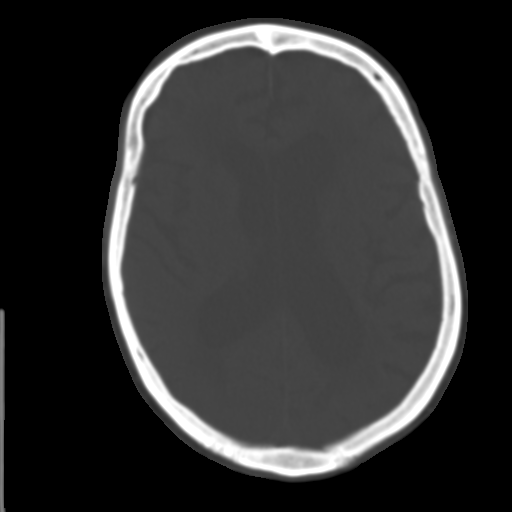
[im 21/34  brain]
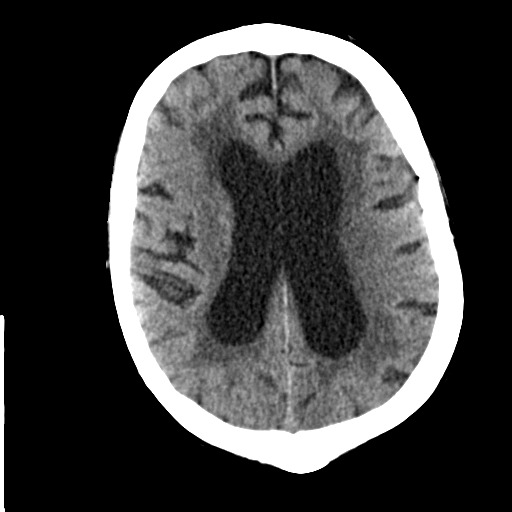
[im 24/34  brain]
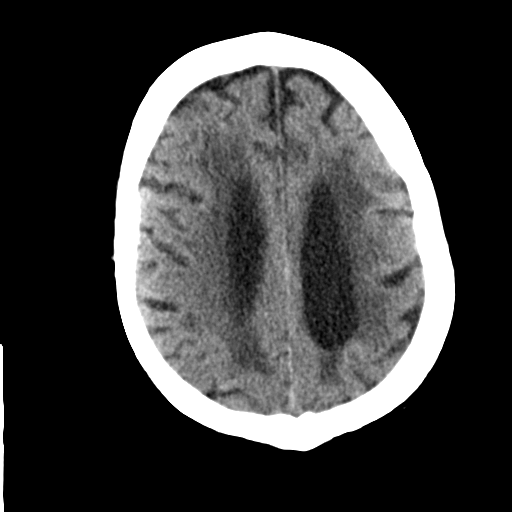
[im 26/34  brain]
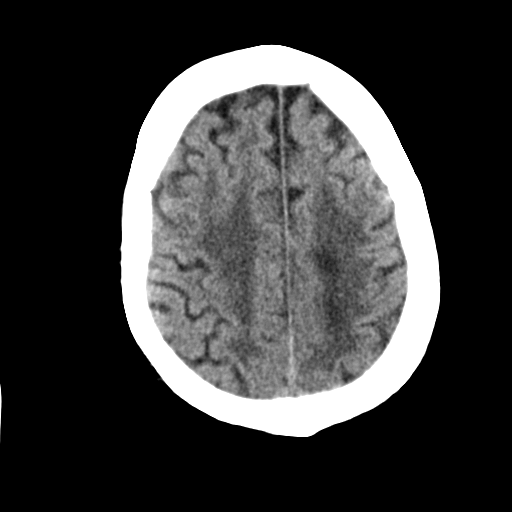
[im 27/34  brain]
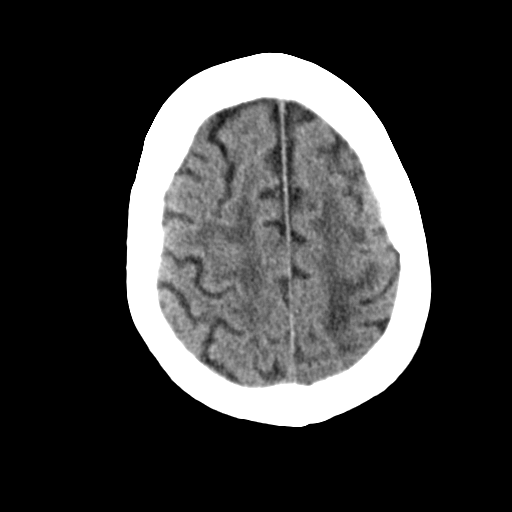
[im 27/34  bone]
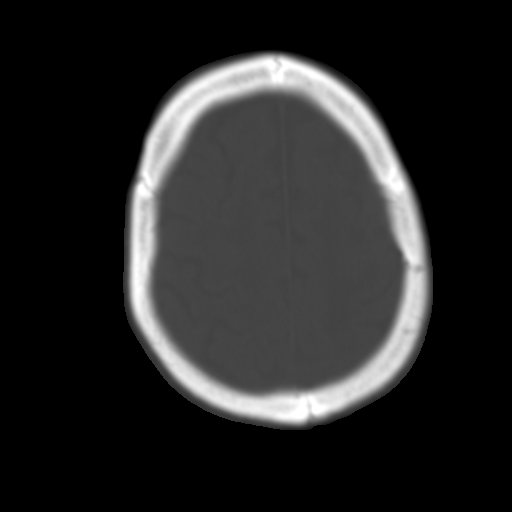
[im 30/34  brain]
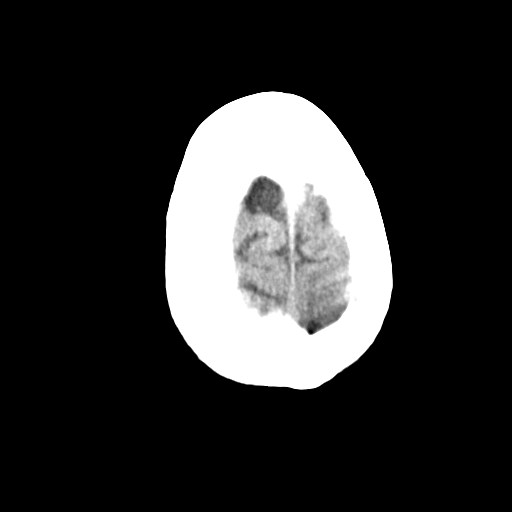
[im 32/34  brain]
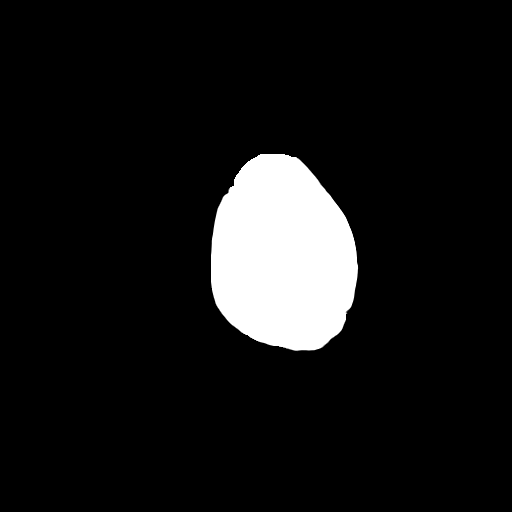

[15 of 30 positions shown; findings below may reference images not displayed]

PROCEDURE:     CT  - CT HEAD WITHOUT CONTRAST  - October 08, 2011 [DATE]

RESULT:     Axial CT scanning was performed through the brain with
reconstructions at 5 mm intervals and slice thicknesses. Comparison is made
to the study 05 October, 2011.

The known posterior parietal lobe infarct on the right has become better
defined. There is no hemorrhagic conversion. There is an old occipital lobe
infarct on the left. There is mild diffuse cerebral and cerebellar atrophy
with compensatory ventriculomegaly which appears stable. There is no
evidence of an acute intracranial hemorrhage. At bone window settings the
observed portions of the paranasal sinuses are clear. There is no evidence
of an acute skull fracture.
IMPRESSION: 1. I do not see definite evidence of acute change in the left parietal or
occipital lobes.
2. There is ongoing evolution of a known acute stroke in the right posterior
parietal lobe.
3. There is no evidence of acute intracranial hemorrhage.
4. There is mild stable diffuse atrophy with moderate compensatory
ventriculomegaly.

## 2013-09-17 ENCOUNTER — Ambulatory Visit: Payer: Self-pay | Admitting: Internal Medicine

## 2013-09-20 ENCOUNTER — Inpatient Hospital Stay: Payer: Self-pay | Admitting: Internal Medicine

## 2013-09-20 LAB — URINALYSIS, COMPLETE
BILIRUBIN, UR: NEGATIVE
Glucose,UR: NEGATIVE mg/dL (ref 0–75)
KETONE: NEGATIVE
Nitrite: NEGATIVE
Ph: 6 (ref 4.5–8.0)
Protein: 30
RBC,UR: 77 /HPF (ref 0–5)
SPECIFIC GRAVITY: 1.014 (ref 1.003–1.030)
SQUAMOUS EPITHELIAL: NONE SEEN
WBC UR: 2943 /HPF (ref 0–5)

## 2013-09-20 LAB — PROTIME-INR
INR: 1.1
Prothrombin Time: 13.9 secs (ref 11.5–14.7)

## 2013-09-20 LAB — CBC
HCT: 38.1 % — ABNORMAL LOW (ref 40.0–52.0)
HGB: 12.1 g/dL — ABNORMAL LOW (ref 13.0–18.0)
MCH: 30.2 pg (ref 26.0–34.0)
MCHC: 31.7 g/dL — ABNORMAL LOW (ref 32.0–36.0)
MCV: 96 fL (ref 80–100)
Platelet: 264 10*3/uL (ref 150–440)
RBC: 3.99 10*6/uL — AB (ref 4.40–5.90)
RDW: 15.8 % — ABNORMAL HIGH (ref 11.5–14.5)
WBC: 9.9 10*3/uL (ref 3.8–10.6)

## 2013-09-20 LAB — TROPONIN I: Troponin-I: 0.13 ng/mL — ABNORMAL HIGH

## 2013-09-20 LAB — COMPREHENSIVE METABOLIC PANEL
ALK PHOS: 90 U/L
ALT: 16 U/L (ref 12–78)
Albumin: 3.4 g/dL (ref 3.4–5.0)
Anion Gap: 7 (ref 7–16)
BILIRUBIN TOTAL: 0.5 mg/dL (ref 0.2–1.0)
BUN: 28 mg/dL — ABNORMAL HIGH (ref 7–18)
CHLORIDE: 106 mmol/L (ref 98–107)
CO2: 25 mmol/L (ref 21–32)
Calcium, Total: 8.8 mg/dL (ref 8.5–10.1)
Creatinine: 1.33 mg/dL — ABNORMAL HIGH (ref 0.60–1.30)
EGFR (African American): 56 — ABNORMAL LOW
GFR CALC NON AF AMER: 48 — AB
Glucose: 102 mg/dL — ABNORMAL HIGH (ref 65–99)
Osmolality: 281 (ref 275–301)
Potassium: 4.4 mmol/L (ref 3.5–5.1)
SGOT(AST): 15 U/L (ref 15–37)
Sodium: 138 mmol/L (ref 136–145)
Total Protein: 8 g/dL (ref 6.4–8.2)

## 2013-09-20 LAB — PHOSPHORUS: PHOSPHORUS: 3.2 mg/dL (ref 2.5–4.9)

## 2013-09-20 LAB — MAGNESIUM: MAGNESIUM: 1.9 mg/dL

## 2013-09-21 LAB — BASIC METABOLIC PANEL
Anion Gap: 11 (ref 7–16)
BUN: 23 mg/dL — ABNORMAL HIGH (ref 7–18)
CHLORIDE: 113 mmol/L — AB (ref 98–107)
Calcium, Total: 7.3 mg/dL — ABNORMAL LOW (ref 8.5–10.1)
Co2: 17 mmol/L — ABNORMAL LOW (ref 21–32)
Creatinine: 1.07 mg/dL (ref 0.60–1.30)
EGFR (African American): >60
EGFR (Non-African Amer.): >60
GLUCOSE: 119 mg/dL — AB (ref 65–99)
OSMOLALITY: 286 (ref 275–301)
Potassium: 4.1 mmol/L (ref 3.5–5.1)
SODIUM: 141 mmol/L (ref 136–145)

## 2013-09-21 LAB — CBC WITH DIFFERENTIAL/PLATELET
Basophil #: 0 10*3/uL (ref 0.0–0.1)
Basophil %: 0.4 %
Eosinophil #: 0 10*3/uL (ref 0.0–0.7)
Eosinophil %: 0.3 %
HCT: 30.7 % — AB (ref 40.0–52.0)
HGB: 9.7 g/dL — AB (ref 13.0–18.0)
LYMPHS ABS: 0.4 10*3/uL — AB (ref 1.0–3.6)
Lymphocyte %: 5.1 %
MCH: 30.2 pg (ref 26.0–34.0)
MCHC: 31.6 g/dL — ABNORMAL LOW (ref 32.0–36.0)
MCV: 96 fL (ref 80–100)
Monocyte #: 1.3 x10 3/mm — ABNORMAL HIGH (ref 0.2–1.0)
Monocyte %: 16.6 %
Neutrophil #: 6.1 10*3/uL (ref 1.4–6.5)
Neutrophil %: 77.6 %
PLATELETS: 191 10*3/uL (ref 150–440)
RBC: 3.2 10*6/uL — ABNORMAL LOW (ref 4.40–5.90)
RDW: 16 % — ABNORMAL HIGH (ref 11.5–14.5)
WBC: 7.9 10*3/uL (ref 3.8–10.6)

## 2013-09-21 LAB — TROPONIN I
TROPONIN-I: 0.12 ng/mL — AB
Troponin-I: 0.09 ng/mL — ABNORMAL HIGH

## 2013-09-21 LAB — CK TOTAL AND CKMB (NOT AT ARMC)
CK, Total: 127 U/L
CK, Total: 161 U/L
CK-MB: 3.6 ng/mL (ref 0.5–3.6)
CK-MB: 3.4 ng/mL (ref 0.5–3.6)

## 2013-09-21 LAB — CK: CK, TOTAL: 71 U/L

## 2013-09-21 LAB — CK-MB: CK-MB: 2.2 ng/mL (ref 0.5–3.6)

## 2013-09-22 LAB — CBC WITH DIFFERENTIAL/PLATELET
BANDS NEUTROPHIL: 1 %
HCT: 30.4 % — AB (ref 40.0–52.0)
HGB: 9.8 g/dL — AB (ref 13.0–18.0)
Lymphocytes: 4 %
MCH: 30.7 pg (ref 26.0–34.0)
MCHC: 32.2 g/dL (ref 32.0–36.0)
MCV: 96 fL (ref 80–100)
Metamyelocyte: 1 %
Monocytes: 4 %
Myelocyte: 1 %
PLATELETS: 202 10*3/uL (ref 150–440)
RBC: 3.18 10*6/uL — AB (ref 4.40–5.90)
RDW: 15.9 % — AB (ref 11.5–14.5)
Segmented Neutrophils: 89 %
WBC: 5.5 10*3/uL (ref 3.8–10.6)

## 2013-09-22 LAB — COMPREHENSIVE METABOLIC PANEL
ALK PHOS: 63 U/L
ALT: 8 U/L — AB (ref 12–78)
ANION GAP: 8 (ref 7–16)
AST: 16 U/L (ref 15–37)
Albumin: 2.3 g/dL — ABNORMAL LOW (ref 3.4–5.0)
BILIRUBIN TOTAL: 0.2 mg/dL (ref 0.2–1.0)
BUN: 21 mg/dL — AB (ref 7–18)
CREATININE: 1.12 mg/dL (ref 0.60–1.30)
Calcium, Total: 7.9 mg/dL — ABNORMAL LOW (ref 8.5–10.1)
Chloride: 114 mmol/L — ABNORMAL HIGH (ref 98–107)
Co2: 18 mmol/L — ABNORMAL LOW (ref 21–32)
EGFR (African American): >60
EGFR (Non-African Amer.): 59 — ABNORMAL LOW
Glucose: 168 mg/dL — ABNORMAL HIGH (ref 65–99)
Osmolality: 286 (ref 275–301)
Potassium: 4.3 mmol/L (ref 3.5–5.1)
Sodium: 140 mmol/L (ref 136–145)
Total Protein: 6 g/dL — ABNORMAL LOW (ref 6.4–8.2)

## 2013-09-22 LAB — URINE CULTURE

## 2013-09-23 LAB — BASIC METABOLIC PANEL
Anion Gap: 9 (ref 7–16)
BUN: 26 mg/dL — AB (ref 7–18)
CALCIUM: 8.1 mg/dL — AB (ref 8.5–10.1)
CHLORIDE: 113 mmol/L — AB (ref 98–107)
Co2: 18 mmol/L — ABNORMAL LOW (ref 21–32)
Creatinine: 1.22 mg/dL (ref 0.60–1.30)
EGFR (Non-African Amer.): 53 — ABNORMAL LOW
GLUCOSE: 108 mg/dL — AB (ref 65–99)
Osmolality: 285 (ref 275–301)
Potassium: 4.3 mmol/L (ref 3.5–5.1)
Sodium: 140 mmol/L (ref 136–145)

## 2013-09-23 LAB — CBC WITH DIFFERENTIAL/PLATELET
BASOS PCT: 0.1 %
Basophil #: 0 10*3/uL (ref 0.0–0.1)
EOS PCT: 0 %
Eosinophil #: 0 10*3/uL (ref 0.0–0.7)
HCT: 29.8 % — ABNORMAL LOW (ref 40.0–52.0)
HGB: 9.6 g/dL — ABNORMAL LOW (ref 13.0–18.0)
LYMPHS ABS: 0.7 10*3/uL — AB (ref 1.0–3.6)
LYMPHS PCT: 8 %
MCH: 30.5 pg (ref 26.0–34.0)
MCHC: 32.1 g/dL (ref 32.0–36.0)
MCV: 95 fL (ref 80–100)
Monocyte #: 0.9 x10 3/mm (ref 0.2–1.0)
Monocyte %: 10.5 %
NEUTROS ABS: 7 10*3/uL — AB (ref 1.4–6.5)
NEUTROS PCT: 81.4 %
Platelet: 229 10*3/uL (ref 150–440)
RBC: 3.14 10*6/uL — ABNORMAL LOW (ref 4.40–5.90)
RDW: 15.6 % — AB (ref 11.5–14.5)
WBC: 8.6 10*3/uL (ref 3.8–10.6)

## 2013-09-23 LAB — CULTURE, BLOOD (SINGLE)

## 2013-09-24 LAB — CBC WITH DIFFERENTIAL/PLATELET
Basophil #: 0 10*3/uL (ref 0.0–0.1)
Basophil %: 0.1 %
Eosinophil #: 0 10*3/uL (ref 0.0–0.7)
Eosinophil %: 0 %
HCT: 33.1 % — ABNORMAL LOW (ref 40.0–52.0)
HGB: 10.5 g/dL — ABNORMAL LOW (ref 13.0–18.0)
LYMPHS PCT: 8.4 %
Lymphocyte #: 0.4 10*3/uL — ABNORMAL LOW (ref 1.0–3.6)
MCH: 29.9 pg (ref 26.0–34.0)
MCHC: 31.7 g/dL — ABNORMAL LOW (ref 32.0–36.0)
MCV: 94 fL (ref 80–100)
MONO ABS: 0.3 x10 3/mm (ref 0.2–1.0)
MONOS PCT: 6.5 %
Neutrophil #: 4.5 10*3/uL (ref 1.4–6.5)
Neutrophil %: 85 %
Platelet: 286 10*3/uL (ref 150–440)
RBC: 3.51 10*6/uL — ABNORMAL LOW (ref 4.40–5.90)
RDW: 15.2 % — ABNORMAL HIGH (ref 11.5–14.5)
WBC: 5.3 10*3/uL (ref 3.8–10.6)

## 2013-09-24 LAB — BASIC METABOLIC PANEL
ANION GAP: 6 — AB (ref 7–16)
BUN: 25 mg/dL — AB (ref 7–18)
CREATININE: 1.22 mg/dL (ref 0.60–1.30)
Calcium, Total: 8.3 mg/dL — ABNORMAL LOW (ref 8.5–10.1)
Chloride: 112 mmol/L — ABNORMAL HIGH (ref 98–107)
Co2: 22 mmol/L (ref 21–32)
EGFR (African American): >60
EGFR (Non-African Amer.): 53 — ABNORMAL LOW
GLUCOSE: 89 mg/dL (ref 65–99)
OSMOLALITY: 283 (ref 275–301)
POTASSIUM: 4.7 mmol/L (ref 3.5–5.1)
Sodium: 140 mmol/L (ref 136–145)

## 2013-09-25 LAB — CULTURE, BLOOD (SINGLE)

## 2013-10-17 ENCOUNTER — Ambulatory Visit: Payer: Self-pay | Admitting: Internal Medicine

## 2013-12-18 DEATH — deceased

## 2014-08-06 NOTE — Consult Note (Signed)
PATIENT NAME:  [name], [name] MR#:  [id number] DATE OF BIRTH:  02-26-1925  DATE OF CONSULTATION:  02/14/2012  REFERRING PHYSICIAN:  Bethann PunchesMark Miller, MD CONSULTING PHYSICIAN:  Stann Mainlandavid P. Sampson GoonFitzgerald, MD  PRIMARY CARE PHYSICIAN: Bethann PunchesMark Miller, MD  REASON FOR CONSULTATION: Extended-spectrum beta-lactamase Escherichia coli urosepsis.   HISTORY OF PRESENT ILLNESS: This is an 81 year old gentleman with a history of Parkinson's disease as well as dementia who resides at [facility]. He was admitted 02/10/2012 with a history of fevers to 102. He also had altered mental status. He was admitted with the diagnosis of possible pneumonia but was found to have also urinary tract infection with extended-spectrum beta-lactamase Escherichia coli and blood culture positive. He was initially treated with Zosyn but then changed to meropenem on 02/12/2012 when culture results came back. He has stabilized and become afebrile. He also had received a dose of vancomycin.   Currently the patient is resting comfortably. He denies any complaints although he does have advanced dementia and there is no family in the room to relate how he has been doing. He has been afebrile since admission and he has also remained hemodynamically stable.   PAST MEDICAL HISTORY:  1. Rheumatoid arthritis.  2. Coronary artery disease.  3. Cerebrovascular disease.  4. Chronic kidney disease.  5. Parkinson's disease.  6. Vascular dementia.  7. Hypertension.   PAST SURGICAL HISTORY:  1. Coronary artery bypass graft. 2. Lens implant. 3. Bilateral knee replacements.   SOCIAL HISTORY: The patient lives at [facility]. No smoking, alcohol or drug use. He does have family involved in his care.  FAMILY HISTORY: Father with blood clots in his 2640s and mother with a blood clot in her 4970s.   REVIEW OF SYSTEMS: Unable to be obtained due to the patient's dementia.   DRUG ALLERGIES: Cogentin.  CURRENT MEDICATIONS: 1. Meropenem 500 mg every 12  hours. 2. Sinemet.  3. Plavix.  4. Prednisone 10 mg once a day.  5. Aspirin.  6. Losartan.  7. Flomax.  8. Atorvastatin.  9. Pantoprazole.  10. Haldol p.r.n.  PHYSICAL EXAMINATION:  VITALS: T-max is 98.9, pulse 85, blood pressure 197/82, respirations 20, and saturation 97% on room air.   GENERAL: He is an elderly gentleman lying across his bedside chair. He is in no acute distress. He was sleeping when I walked in but woke to voice. He knows he is at [facility].   EYES: Pupils are equal and reactive to light. Extraocular movements are intact. Sclerae anicteric.   OROPHARYNX: Mucous membranes are dry.   CARDIAC: Heart is regular.  PULMONARY: Lungs are clear.   ABDOMEN: Soft, nontender, and nondistended. No hepatosplenomegaly.   EXTREMITIES: No clubbing, cyanosis, or edema.   SKIN: He has multiple bruises on his hands and his abdomen from prior injections.  DATA: White blood count today is 7.5, hemoglobin 10.3, and platelets 246. On admission his white blood count was 9.8. BMP: BUN 24 and creatinine 1.19, otherwise normal.   Urine culture from 02/10/2012 revealed Escherichia coli with ESBL producer sensitive to nitrofurantoin and imipenem and ertapenem but resistant to cefazolin, ampicillin, ceftriaxone, ciprofloxacin, gentamicin, Bactrim, and levofloxacin.   Blood culture done 02/10/2012 revealed similar ESBL in one of two bottles. It was sensitive to imipenem, tigecycline, and ertapenem.    Urinalysis on admission had 80 white blood cells, 2+ leukocyte esterase, and negative nitrites.   Chest x-ray showed an infiltrate in both lung bases consistent with either pneumonia or atelectasis.   ASSESSMENT  AND RECOMMENDATIONS: An 81 year old with multiple comorbidities now with an ESBL producing Escherichia coli urosepsis with positive blood cultures. Clinically, he is stable and improving. He has been on meropenem. He will need at least a 10 day treatment  course for the urinary infection. There are no oral options except for nitrofurantoin available and that would not be effective for his bloodstream infection. At this point, I would recommend he continue on the antibiotics through 02/21/2012.  I will discuss with the family whether they think he would tolerate a PICC line and transfer back to [facility] or whether he will need other methods of care.   Thank you for the consult. We will be glad to follow with you.   ____________________________ Stann Mainland. Sampson Goon, MD dpf:slb D: 02/14/2012 16:28:40 ET      T: 02/14/2012 16:51:24 ET          JOB#: [id number]  cc: Stann Mainland. Sampson Goon, MD, <Dictator> Danella Penton, MD Rainer Mounce [facility] MD ELECTRONICALLY SIGNED 02/24/2012 18:48

## 2014-08-06 NOTE — H&P (Signed)
PATIENT NAME:  [name], [name] MR#:  [id number] DATE OF BIRTH:  22-Aug-1924  DATE OF ADMISSION:  02/10/2012  ADMITTING PHYSICIAN: Enid Baas, MD  PRIMARY CARE PHYSICIAN: Bethann Punches, MD  CHIEF COMPLAINT: Fever and change in mental status.   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 The patient has dementia and is not great historian. Most of the history is obtained from the daughter, Ms. Donavan Burnet, at bedside. According to her, this patient with dementia usually does not complain but has been weak and lethargic over the weekend. They have been checking his vitals. He has been afebrile. He has had a cough and found to be febrile with a fever of 102 and altered mental status this evening and so was sent here. The patient again is not a great historian and denies any pain on questioning him, although has some occasional cough.   PAST MEDICAL HISTORY:  1. Rheumatoid arthritis.  2. Coronary artery disease status post bypass graft surgery. 3. Cerebrovascular accident with bilateral visual field defects.  4. Chronic kidney disease stage III with baseline creatinine around 1.3.  5. Parkinson's disease.  6. Vascular dementia.  7. Hypertension.   PAST SURGICAL HISTORY:  1. Coronary artery bypass graft surgery.  2. Lens implant. 3. Bilateral knee replacements.   ALLERGIES: Cogentin.   CURRENT MEDICATIONS:  1. Aspirin 81 mg p.o. daily.  2. Verapamil 180 mg p.o. daily.  3. Cozaar 25 mg p.o. daily.  4. Flomax 0.4 mg daily.  5. Folic acid 1 mg daily.  6. Lipitor 10 mg daily.  7. Methotrexate 7.5 mg weekly, on Fridays.  8. Multivitamin 1 tablet daily.  9. Plavix 75 mg p.o.  daily.  10. Prednisone 10 mg p.o. daily.  11. Protonix 40 mg p.o. daily.  12. Synthroid 50 mcg p.o. daily.  13. Sinemet 25/100 mg one tablet p.o. three times daily. 14. Tylenol 650 mg every six hours p.r.n.  15. Flexeril 5 mg p.o. p.r.n. for muscle aches.   SOCIAL HISTORY: Currently living at [facility] facility. No smoking, alcohol, or drug use.   FAMILY HISTORY: Father died in his 49s of blood clot after trauma surgery and mother died in her 86s from a blood clot.   REVIEW OF SYSTEMS: Difficult to be obtained secondary to the patient's dementia and current changes in mental status.  PHYSICAL EXAMINATION:   VITAL SIGNS: Temperature 99.1 degrees Fahrenheit, pulse 96, respirations 40, blood pressure 180/75, and pulse oximetry 98% on room air.  GENERAL: Well-built, well-nourished elderly male lying in bed, not in any acute distress.   HEENT: Normocephalic, atraumatic. Pupils equal, round, and reacting to light. Anicteric sclerae. Extraocular movements intact. Oropharynx with very dry mucous membranes, but clear without erythema, mass or exudates.   NECK: Supple. No thyromegaly, JVD, or carotid bruits. No lymphadenopathy.   LUNGS: Moving air bilaterally. Some rhonchi at the left base. No wheeze or crackles. No use of accessory muscles for breathing.   HEART: S1 and S2 loud 4/6 systolic murmur in the precordial area. No rubs or gallops.   ABDOMEN: Soft, nontender, and nondistended. No hepatosplenomegaly. Normal bowel sounds.   EXTREMITIES: No pedal edema. No clubbing or cyanosis. 1+ feeble dorsalis pedis pulses palpable bilaterally.   SKIN: No acne, rash,  or lesions.   LYMPHATICS: No cervical lymphadenopathy.   NEUROLOGIC: The patient is sleepy, easily arousable, able to move all four extremities, especially in bilateral lower extremities but not following commands. Answering some simple questions like his name and also date of birth, but not oriented at this time.    PSYCH: Again, sleepy but arousable, not oriented at this time.   LABORATORY, DIAGNOSTIC AND RADIOLOGIC DATA: WBC 9.8, hemoglobin 11.0, hematocrit 32.1, and platelet count 213.   Sodium 141, potassium 4.2, chloride 111, bicarbonate 20, BUN 33, creatinine 1.3, glucose 119, and calcium 7.9.   ALT 17, AST 14, alkaline phosphatase 68, total bilirubin 0.5, and albumin 2.6. Troponin is 0.02.   Urinalysis is showing 2+ leukocyte esterase, 80 WBCs, 1+ bacteria, and 1+ blood with only 2 RBCs seen.   Chest x-ray is showing hazy infiltrate seen in the left lower lobe and also coronary artery bypass graft surgery changes.  EKG is showing normal sinus rhythm, heart rate of 99, and incomplete right bundle branch block is seen.  No acute ST-T wave abnormalities are noted.   ASSESSMENT AND PLAN: An 81 year old male with past medical history of cerebrovascular accident, vascular dementia, Parkinson's, and rheumatoid arthritis brought in from [facility] secondary to fever and altered mental status.  1. Systemic antiinflammatory response syndrome likely secondary to pneumonia and urinary tract infection. Treatment as mentioned below. 2. Left lower lobe pneumonia. Treat as healthcare-acquired pneumonia since the patient is a nursing home resident and presented with high fevers. Blood cultures are already sent. We will start on Zyvox and Zosyn empirically. 3. Acute on chronic mental status changes likely acute metabolic encephalopathy and probable dementia, Parkinson's disease, likely from his underlying infection. Treat with antibiotics, gentle IV fluids, and monitor. 4. Acute on chronic renal failure. Baseline creatinine is around 1.1 to 1.2. He comes in with 1.33 and slightly elevated BUN. Gentle IV hydration. Monitor creatinine every day, especially while the patient is on IV antibiotics. Likely prerenal from the infection. 5. Urinary tract infection. Urine cultures have been drawn and now the patient is  on antibiotics.  6. History of cerebrovascular accident. We will continue his aspirin and Plavix. CT of head with no acute findings.  7. Parkinson's disease. Continue Sinemet.  8. Rheumatoid arthritis. On methotrexate, folic acid and p.o. prednisone. 9. GI and DVT prophylaxis. On Protonix, aspirin, and Plavix.   CODE STATUS: DO NOT RESUSCITATE. The patient has an out-of-facility DO NOT RESUSCITATE order signed and in the chart.   TIME SPENT ON ADMISSION: 50 minutes. ____________________________ Enid Baasadhika Catrena Vari, MD rk:slb D: 02/10/2012 03:40:11 ET T: 02/10/2012 08:17:49 ET JOB#: [id number]  cc: Enid Baasadhika Addelynn Batte, MD, <Dictator> Danella PentonMark F. Miller, MD Enid BaasADHIKA Shia Eber MD ELECTRONICALLY SIGNED 02/11/2012 11:26

## 2014-08-06 NOTE — Discharge Summary (Signed)
PATIENT NAME:  [name], [name] MR#:  [id number] DATE OF BIRTH:  1924/05/11  DATE OF ADMISSION:  02/10/2012 DATE OF DISCHARGE:  02/17/2012   PRIMARY CARE PHYSICIAN: Bethann PunchesMark Miller, MD   DISCHARGE DIAGNOSES:  1. SIRS with urosepsis due to ESBL producing  Escherichia coli.  2. Acute renal failure.  3. Parkinson's disease.  4. Hypertension.  5. Cerebrovascular accident.  6. Rheumatoid arthritis.   HISTORY OF PRESENT ILLNESS: This is an 81 year old gentleman with a history of Parkinson's disease who was admitted with a fever and altered mental status. He was living at [facility] nursing home and has a history of dementia as well. In the ED he had a fever of 102 and was confused. Urinalysis revealed a urinary tract infection. The patient was admitted, placed on Zosyn. Cultures revealed Escherichia coli with ESBL producing resistance in both blood and urine.   HOSPITAL COURSE:  1. The patient was given IV fluids and started initially on Zosyn. Culture results revealed ESBL producing organism. The patient was placed on meropenem starting on the 26th. He completed six days of IV antibiotics. He remained stable. His urine culture was sensitive to Macrobid and his renal function improved. He will be discharged on nitrofurantoin 100 mg twice a day for seven more days.  2. Hypotension and hypertension. The patient was initially septic but resolved with IV fluids. He was restarted on his outpatient antihypertensive medications.  3. Parkinson's disease, remained stable.  4. Dementia, remained stable with limited agitation or other complications.   DISCHARGE MEDICATIONS:  1. Macrobid 100 mg twice a day for seven days.  2. Plavix 75 once a day.  3. Lipitor 10 mg once a day.  4. Flomax 0.4 once a day.  5. Prednisone 10 mg once a day.  6. Levothyroxine 50 mcg once a day.  7. Pantoprazole 40 mg once a day.  8. Aspirin 81 mg once a day.  9. Calan SR 180 mg twice a day.  10. Cozaar 25 mg once a day.   11. Folic acid 1 mg once a day.  12. Sinemet 25/100 one tablet 3 times a day.  13. Flexeril 5 mg twice a day.   DISCHARGE DIET: Low salt, mechanical soft.  DISCHARGE FOLLOW-UP: The patient will follow-up with Dr. Hyacinth MeekerMiller in 2 to 4 weeks.   TIME SPENT ON DISCHARGE: 40 minutes.   ____________________________ Stann Mainlandavid P. Sampson GoonFitzgerald, MD dpf:drc D: 02/17/2012 13:32:05 ET T: 02/17/2012 13:41:16 ET JOB#: [id number]  cc: Stann Mainlandavid P. Sampson GoonFitzgerald, MD, <Dictator> Ramonte Mena Sampson GoonFITZGERALD MD ELECTRONICALLY SIGNED 02/24/2012 18:49

## 2014-08-10 NOTE — H&P (Signed)
PATIENT NAME:  [name], COTHRON MR#:  [id number] DATE OF BIRTH:  1924/09/29  DATE OF ADMISSION:  09/21/2013  PRIMARY CARE PHYSICIAN: Dr. Emily Filbert  REFERRING PHYSICIAN: Harvest Dark, MD  CHIEF COMPLAINT: Altered mental status.   HISTORY OF PRESENT ILLNESS: The patient is an 81 year old male with past medical history of hyperlipidemia, chronic renal insufficiency, Parkinson disease, recurrent UTIs with multidrug resistant organisms, coronary artery disease, and rheumatoid arthritis who is brought into the ER after he was found lethargic and with altered mental status. The patient is residing at [facility] and he was in his usual state of health until yesterday afternoon. Subsequently, the patient has found to be lethargic and the nursing home staff had noticed him slumped over in the wheelchair. The patient was placed in the bed. Subsequently, during the evening rounds, with the patient still being lethargic, EMS was called and the patient was sent over to the ER. His temperature was 103 degrees Fahrenheit initially with respiratory rate at 30. The patient's urine was positive for urinary tract infection. The patient was found to be septic. Blood cultures were obtained and the patient was given Invanz given the history of multidrug resistant organism and frequent UTIs, and the hospitalist team is called to admit the patient. The patient's troponin is elevated at 0.13, but 12-lead EKG did not reveal any EKG changes. During my examination, the patient is still lethargic but arousable to verbal commands. Son, who is the medical power of attorney, is at bedside.   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   PAST SURGICAL HISTORY: CABG, lens implant,  bilateral knee replacement.  ALLERGIES: COGENTIN.  PSYCHOSOCIAL HISTORY: Residing at [facility]. No history of smoking, alcohol or illicit drug usage.   FAMILY HISTORY: Father died at age 74 from blood clot after trauma surgery and mother died in 28s from blood clot.   REVIEW OF SYSTEMS: Unobtainable as the patient is still lethargic and has baseline dementia.   PHYSICAL EXAMINATION:  VITAL SIGNS: Temperature was 100.3, pulse 79, respirations 31, blood pressure 151/73, pulse ox 97%.  GENERAL APPEARANCE: Not in acute distress, but the patient is lethargic, moderately built and nourished.  HEENT: Normocephalic, atraumatic. Pupils are equally reacting to light and accommodation. No scleral icterus. No conjunctival injection. No sinus tenderness. No postnasal drip. Dry mucous membranes.  NECK: Supple. No JVD or thyromegaly. Range of motion is intact.  LUNGS: Clear to auscultation bilaterally. No accessory muscle usage. No anterior chest wall tenderness.  CARDIOVASCULAR: S1, S2 normal. Regular rate and rhythm. Positive ASM, grade 4. No gallops. No clicks.  ABDOMEN: Soft. Bowel sounds are positive in all 4 quadrants. Nontender, nondistended. No masses felt. NEUROLOGIC: Lethargic but arousable, was falling asleep and not answering any questions. Not following verbal commands. Reflexes are 2+.  EXTREMITIES: No edema. No cyanosis. No clubbing.  SKIN: Warm to touch. Dry in nature. No rashes. No lesions.  MUSCULOSKELETAL: No joint effusion, tenderness, erythema.  PSYCHIATRIC: Mood and affect could not be elicited given the baseline dementia and altered mental status.  DIAGNOSTIC DATA: Glucose 102, BUN 28, creatinine 1.33. Sodium, potassium and chloride are normal. GFR 48. Anion gap and serum osmolality are normal. Calcium is 8.8. LFTs are normal. Total CK 71. CPK-MB 2.2. Troponin 0.13. WBC 9.9, hemoglobin 12.1, hematocrit 38.1, platelets 264,000,  and MCV 96. PT 13.9. INR 1.1.    Urinalysis: Yellow in color, glucose negative, bilirubin negative, ketones are negative, WBC clumps are present, nitrite negative, leukocyte esterase 3+.  PH 7.45, pCO2 29, pO2 245 on 100% FiO2. Lactic acid 0.6.  Portable chest x-ray: No acute findings.   EKG: Normal sinus rhythm at 85 beats per minute, left axis deviation, incomplete right bundle branch block.   ASSESSMENT AND PLAN: An 81 year old male brought into the ER for altered mental status and being lethargic since yesterday afternoon.  1.  Altered mental status with baseline dementia, probably secondary to sepsis from acute cystitis. We will admit him to med/surg floor. Blood cultures and urine cultures were ordered. The patient will be on Invanz given the history of multidrug resistant organisms and recurrent urinary tract infections.  2.  Elevated troponin, probably demand ischemia. We will rule out non-ST-segment elevation. We will trend cardiac enzymes. Cardiology consult is placed to [facility] cardiology group.  3.  Chronic renal insufficiency. The patient seemed to be at baseline, but still being dry will provide him intravenous fluids and monitor renal function closely.  4.  Chronic history of dementia.  5.  Chronic history of hyperlipidemia. We will resume his home medication.  6.  Chronic history of Parkinson disease. We will continue Sinemet.   He is DNR. We will provide him GI and DVT prophylaxis. His son is his medical power of attorney. Plan of care was discussed in detail with the patient's son at bedside. Will transfer the patient to Dr. Emily Filbert in the a.m.   TOTAL TIME SPENT ON ADMISSION: 50 minutes.   ____________________________ Nicholes Mango, MD ag:sb D: 09/21/2013 07:34:30 ET T: 09/21/2013 08:27:59 ET JOB#: [id number]  cc: Nicholes Mango, MD, <Dictator> Rusty Aus, MD Nicholes Mango MD ELECTRONICALLY SIGNED 09/24/2013 1:06

## 2014-08-10 NOTE — Discharge Summary (Signed)
PATIENT NAME:  [name], [name] MR#:  [id number] DATE OF BIRTH:  1924/09/20  DATE OF ADMISSION:  09/20/2013 DATE OF DISCHARGE:  09/25/2013   DISCHARGE DIAGNOSES:  1. Escherichia coli sepsis with systemic inflammatory response syndrome.   2. Acute right thalamus infarct.  3. Rheumatoid arthritis.  4. Coronary artery disease.  5. Positive troponins due to demand ischemia.  6. Chronic renal insufficiency.  7. Parkinson's disease.  8. Vascular dementia.  9. Hypertension.  10. Encephalopathy due to infection.   DISCHARGE MEDICATIONS:  1. Flomax 0.4 mg daily.  2. Prednisone 10 mg daily.  3. Synthroid 50 mcg daily.  4. Tylenol 650 mg q.4 p.r.n.  5. Protonix 40 mg daily.  6. Plavix 75 mg daily.  7. Aspirin 81 mg daily.  8. Sinemet 25/100 t.i.d. 9. Folic acid 1 mg daily. 10. Lipitor 10 mg at bedtime.  11. Methotrexate 2.5 mg 3 tabs each Saturday.  12. Multivitamin daily.  13. PreserVision 1 tab b.i.d. 14. Ferrous sulfate 325 mg daily.  15. Systane Balance ophthalmic solution 1 drop both eyes t.i.d. 16. Losartan 50 mg b.i.d.  17. Verapamil 180 mg daily.  18. Amlodipine 5 mg q.a.Phelps. 19. Ertapenem 1 gram IV q.24 through Friday, June 12th.   REASON FOR ADMISSION: An 81 year old male presents with altered mental status, fever and SIRS. Please see H and P for HPI, past medical history and physical exam.   HOSPITAL COURSE: The patient was admitted. He was septic with multiresistant E. coli, only sensitive to imipenem. He received ertapenem, with improvement in his white count and defervesced, and his mental status came back to baseline. He was found on CT to have a small right thalamic CVA. He will continue on his aspirin, Plavix and Lipitor. Overall, his prognosis is poor. He is in hospice care.    ____________________________ Danella PentonMark F. Wai Minotti, MD mfm:lb D: 09/25/2013 07:58:56 ET T: 09/25/2013 08:22:11 ET JOB#: [id number]  cc: Danella PentonMark F. Ezreal Turay, MD, <Dictator> Cedricka Sackrider Sherlene ShamsF Kathie Posa MD ELECTRONICALLY  SIGNED 09/25/2013 18:01

## 2014-08-10 NOTE — Consult Note (Signed)
PATIENT NAME:  [name], Geral M MR#:  [id number] DATE OF BIRTH:  Aug 14, 1924  DATE OF CONSULTATION:  09/21/2013  REFERRING PHYSICIAN:  Ramonita LabAruna Gouru, MD CONSULTING PHYSICIAN:  Lamar BlinksBruce J. Kowalski, MD  REASON FOR CONSULTATION: Anemia, hypertension, bypass surgery, peripheral vascular disease, aortic stenosis  elevation of troponin.   CHIEF COMPLAINT: The patient is obtunded.   HISTORY OF PRESENT ILLNESS: This is an 81 year old male with known coronary artery disease, status post coronary artery bypass graft; peripheral vascular disease, aortic valve stenosis to a moderate degree, who has had elevation of troponin most consistent with demand ischemia due to acute urinary tract infection and sepsis with hypotension. The patient has been given fluids with improvements of these issues and no apparent current bacteremia. The patient is recent slowly improving and hemodynamically stable today. There is an EKG showing normal sinus rhythm with nonspecific ST changes and no other concerns of acute myocardial infarction.  REVIEW OF SYSTEMS: Remainder cannot be assessed due to obtundation.   PAST MEDICAL HISTORY: 1.  Anemia.  2.  Coronary artery bypass.  3.  Aortic stenosis.  4.  Peripheral vascular disease.   FAMILY HISTORY: No apparent family history of cardiovascular disease.   SOCIAL HISTORY: The patient currently has no alcohol or tobacco use.   DRUG ALLERGIES: As above.  MEDICATIONS: As above.   PHYSICAL EXAMINATION: VITAL SIGNS: Blood pressure is 90/40. Heart rate is 76 and irregular.  GENERAL: He is a disheveled elderly male in no acute distress.  HEAD, EYES, EARS, NOSE, THROAT: No icterus, thyromegaly, ulcers, hemorrhage, or xanthelasma.  CARDIOVASCULAR: Irregularly irregular with normal S1, soft S2, with a III/VI right upper sternal border murmur radiating throughout. PMI is diffuse. Carotid upstroke normal with no radiation. Jugular venous pressure is normal.  LUNGS: Have a few expiratory  wheezes.  ABDOMEN: Soft, nontender. No apparent hepatosplenomegaly or masses. Abdominal aorta is normal size without bruit.  EXTREMITIES: Show 2+ radial, 1+ femoral, trace dorsal pedal pulses, with trace to 1+ lower extremity edema. No cyanosis, clubbing, or ulcers.  NEUROLOGIC: The patient is somewhat sleepy.   ASSESSMENT: This is an 81 year old male with bypass surgery, peripheral vascular disease, aortic stenosis, with acute urinary tract infection and sepsis with hypotension, minimal elevation of troponin consistent with demand ischemia, and no current evidence of myocardial infarction.   RECOMMENDATIONS: 1.  Continue supportive care, antibiotics and fluids.  2.  No further intervention. Minimal elevation of troponin.  3.  Further consideration of re-evaluation of aortic valve stenosis depending on clinical status. 4.  No additional blood pressure medications at this time due to hypotension.  5.  Further treatment options after above.   ____________________________ Lamar BlinksBruce J. Kowalski, MD bjk:jcm D: 09/21/2013 14:57:40 ET T: 09/21/2013 16:15:37 ET JOB#: [id number]  cc: Lamar BlinksBruce J. Kowalski, MD, <Dictator> Lamar BlinksBRUCE J KOWALSKI MD ELECTRONICALLY SIGNED 09/25/2013 14:45

## 2014-08-11 NOTE — Consult Note (Signed)
Comments   Met with pt's daughter who shares HCPOA with her brother. Daughter understands dx of CVA and that patient may not do well. We discussed nutrition status and daughter feels patient would not want to feeding tube. She confirms DNR. She agrees that pt will need placement at discharge and in fact family had already started thinking about this process before admission. Will speak to CM to begin bed search. Pt may qualify for hospice services dependent upon progress. Will follow.  15 minutes  Electronic Signatures: Borders, Kirt Boys (NP)  (Signed 20-Jun-13 11:40)  Authored: Palliative Care   Last Updated: 20-Jun-13 11:40 by Irean Hong (NP)

## 2014-08-11 NOTE — H&P (Signed)
PATIENT NAME:  [name], [name] DATE OF BIRTH:  23-Oct-1924  DATE OF ADMISSION:  10/05/2011  PRIMARY CARE PHYSICIAN: Bethann PunchesMark Miller, MD  CHIEF COMPLAINT: Disoriented.   HISTORY OF PRESENT ILLNESS: This is an 81 year old man who is a little hazy today and disoriented. Last night it seemed to have started. As per the family, he has good and bad days. He was less talkative today. Today had something that he was eating with his right hand but after he was done eating it he kept on going through the motion like he was eating it but there was nothing in his right hand. He refused every one [facility], which he is usually very cooperative. He was not using his left side that well. His speech was a little slurred. He was staring to the right. He was not focusing. He was having a headache. He seemed to get a little bit better in the ER, but then family said he was slurring a little bit again.  PAST MEDICAL HISTORY:  1. Rheumatoid arthritis.  2. Coronary artery disease.  3. Cerebrovascular accident. 4. Chronic kidney disease. 5. Parkinson's disease. 6. Vascular dementia. 7. Hypertension.   PAST SURGICAL HISTORY:  1. Bypass. 2. Lens implant. 3. Bilateral knee replacement.   ALLERGIES: Cogentin.   MEDICATIONS:  1. Methotrexate 7.5 mg on Saturday.  2. Vitamin D 250,000 units every month.  3. Protonix 40 mg daily.  4. Plavix 75 mg daily.  5. Folic acid 1 mg daily.  6. Levothyroxine 50 mcg daily.  7. Prednisone 10 mg daily.  8. Multivitamin daily.  9. Flomax 0.4 mg daily.  10. Sinemet 25/100 mg 1/2 tablet three times a day. 11. Lipitor 10 mg at bedtime.  12. Verapamil 180 mg at bedtime.  13. PRN Nitrostat. 14. PRN loperamide. 15. PRN tramadol.   SOCIAL HISTORY: [facility].  No smoking. No alcohol. No drug use.   FAMILY HISTORY: Father died in his 1840s of a blood clot after trauma surgery and mother died at 3277 of blood clot.    REVIEW OF SYSTEMS: CONSTITUTIONAL: Positive for headache. No fever, chills, or sweats. No weight gain. Positive for weight loss, 10 pounds over the past two months. Positive for weakness. EYES: Last CVA affected his vision. EARS, NOSE, MOUTH, AND THROAT: Decreased hearing. Positive for runny nose. No sore throat. No difficulty swallowing. CARDIOVASCULAR: No chest pain. No palpitations. RESPIRATORY: No shortness of breath. No coughing. No sputum. No hemoptysis. GASTROINTESTINAL: Positive for sore abdomen on Sunday. No nausea. No vomiting. No diarrhea. No constipation. No bright red blood per rectum. No melena. GENITOURINARY: No burning on urination or hematuria. MUSCULOSKELETAL: No joint pain or muscle pain. INTEGUMENT: No rashes or eruptions. NEUROLOGIC: No fainting or blackouts. PSYCHIATRIC: No anxiety or depression. ENDOCRINE: Positive for hypothyroidism. HEMATOLOGIC/LYMPHATIC: No anemia. No easy bruising or bleeding.   PHYSICAL EXAMINATION:   VITAL SIGNS: On presentation temperature was 97, pulse 72, respirations 20, blood pressure 191/88, and pulse oximetry 97% on room air.   GENERAL: No respiratory distress.   EYES: Conjunctivae and lids normal. Pupils equal, round, and reactive to light. Extraocular muscles intact. No nystagmus.   EARS, NOSE, MOUTH, AND THROAT: Tympanic membrane on the right is obscured by wax. On the left no erythema. Nasal mucosa no erythema. Throat no erythema. No exudate seen. Lips and gums no lesions.   NECK: No JVD. No bruits. No lymphadenopathy. No thyromegaly. No thyroid nodules palpated.  LUNGS: Lungs are clear to auscultation. No use of accessory muscles to breathe. No rhonchi, rales, or wheeze heard.   HEART: S1 and S2 normal. 3/6 systolic ejection murmur. Carotid upstroke 2+ bilaterally. No bruits. Dorsalis pedis pulses 2+ bilaterally. No edema of the lower extremities.   ABDOMEN: Soft and nontender. No organomegaly/splenomegaly. Normoactive bowel sounds. No  masses felt.   LYMPHATIC: No lymph nodes in the neck.   MUSCULOSKELETAL:  Positive clubbing. No cyanosis. No edema.   SKIN: Bruising over the knees and arms.   NEUROLOGIC: The patient is able to straight leg raise bilaterally. Has more difficulty following commands with the upper extremities, but power on the lower extremities seems good, 5/5, and the upper extremities 4/5 bilaterally. Babinski upgoing on the right, equivocal on the left. Sensation to light touch grossly intact. Some slurred speech.  PSYCHIATRIC: The patient answers yes or no questions appropriately. He seems to have slurred speech occasionally and difficult to understand occasionally. Oriented to person and place.  LABORATORY, DIAGNOSTIC AND RADIOLOGIC DATA: CT scan of the head shows subacute nonhemorrhagic right parietal lobe infarct.   White blood cell count 10.9, hemoglobin and hematocrit 12.9 and 40.6, and platelet count 231. Glucose 114, BUN 32, creatinine 1.36, sodium 138, potassium 4.2, chloride 104, CO2 24, and calcium 8.9. Liver function tests normal range. GFR 47. INR 0.9. Troponin negative. TSH 2.07.   EKG showed normal sinus rhythm, 69 beats per minute, incomplete right bundle branch block, poor R wave progression, and flattening of T waves laterally.   ASSESSMENT AND PLAN:  1. Subacute cerebrovascular accident. With symptoms today may be another cerebrovascular accident on top of that. We will get a MRI of the brain, echocardiogram, and carotid ultrasound. PT and OT therapy. Since the patient had a stroke on Plavix, we will have to add aspirin to the Plavix, increased risk of bleeding with doing so. Overall prognosis is poor. We will continue the patient's DO NOT RESUSCITATE status.  2. Malignant hypertension. We will give verapamil at night and the Flomax at night. We will add low dose hydralazine. Would like the blood pressure to get a little bit tonight and even better control tomorrow.  3. Rheumatoid arthritis.  On methotrexate and prednisone.  4. Parkinson's. On Sinemet.  5. Dementia. Basically bedbound, not sure how much physical therapy he will do.  6. Chronic kidney disease. Creatinine slightly up from last time. We will give a liter of IV fluid. 7. Coronary artery disease. No symptoms. Had heart block with beta blocker so beta blocker contraindicated. Continue Plavix and aspirin added.  8. Hypothyroidism. Continue Synthroid. TSH normal range.  9. Benign prostatic hypertrophy. Continue Flomax.  CODE STATUS: DO NOT RESUSCITATE.   TIME SPENT ON ADMISSION: 55 minutes.  ____________________________ Herschell Dimes. Renae Gloss, MD rjw:slb D: 10/05/2011 21:07:53 ET    T: 10/06/2011 08:38:14 ET       JOB#: [id number] cc: Herschell Dimes. Renae Gloss, MD, <Dictator> Danella Penton, MD Salley Scarlet MD ELECTRONICALLY SIGNED 10/11/2011 12:14

## 2014-08-11 NOTE — Discharge Summary (Signed)
PATIENT NAME:  [name], [name] MR#:  [id number] DATE OF BIRTH:  [birth year]  DATE OF ADMISSION:  08/01/2011 DATE OF DISCHARGE:  08/05/2011  DISCHARGE DIAGNOSES:  1. Infectious diarrhea secondary to Norovirus.  2. Dehydration, acute.  3. Rheumatoid arthritis.  4. Parkinson's disease.  5. Vascular dementia.  6. Hypertension.   DISCHARGE MEDICATIONS:  1. Imodium AD 2 mg t.i.d. p.r.n.  2. Tylenol 650 mg q.4 p.r.n.  3. Carbidopa/levodopa 25/100 t.i.d.  4. Flomax 0.5 mg daily.  5. Folic acid 1 mg daily.  6. Levothyroid 50 mcg daily.  7. Lipitor 10 mg at bedtime.  8. Methotrexate 7.5 mg every week.  9. Multivitamin daily.  10. Nystatin 1000 oral suspension b.i.d. p.r.n.  11. Protonix 40 mg daily.  12. Plavix 75 mg daily.  13. Prednisone 10 mg daily.  14. Tramadol 50 mg t.i.d. p.r.n.  15. Verapamil 180 mg daily.  16. Vitamin D 50,000 units weekly.   REASON FOR ADMISSION: 81 year old male presents with dehydration and diarrhea with some mild confusion. Please see history and physical for history of present illness, past medical history, and physical exam.   HOSPITAL COURSE: The patient was admitted, hydrated. Stool did come back positive for Norovirus. His diarrhea resolved and really had no diarrhea for 36 hours prior to discharge. He was felt not to be infectious anymore and will not be under isolation and will [facility]. His electrolytes normalized. He was given potassium p.o. x1. Overall prognosis is guarded    ____________________________ Danella PentonMark F. Alianna Wurster, MD mfm:rbg D: 08/05/2011 08:02:28 ET T: 08/05/2011 14:19:49 ET JOB#: [id number]  cc: Danella PentonMark F. Kanen Mottola, MD, <Dictator> Kyle Coye [name] MD ELECTRONICALLY SIGNED 08/06/2011 7:48

## 2014-08-11 NOTE — Consult Note (Signed)
Referring Physician:  Loletha Grayer :   Primary Care Physician:  Loletha Grayer : [facility], [facility], [address], [place], [place] [address], [place]  Reason for Consult:  Admit Date: 05-Oct-2011   Chief Complaint: confusion   Reason for Consult: altered mental status   History of Present Illness:  History of Present Illness:   81 yo RHD M presents with confusion and not really paying attention to his R side.  This has continued for the past few days while he lived at [facility] so family was concerned so they bought him into the ER to be evaluated.  In ER, it was noted that he has R occipital subacute infarct so he was admitted.  Family states that he has a baseline of immobility secondary to severe rheumatoid arthritis and Parkinsons and is total assist.  However, over the last few days that note that he has cognitively changes and now having problems feeding himself and new onset confusion.  ROS:   General denies complaints    HEENT difficulty seeing    Lungs no complaints    Cardiac no complaints    GI no complaints    GU no complaints    Musculoskeletal joint pain    Extremities no complaints    Skin no complaints    Neuro confusion    Endocrine no complaints    Psych no complaints   Past Medical/Surgical Hx:  mobitx type 2:   hyperlipidemia:   hyporthyroid:   dementia; vascular dementia:   CVA:   chronic kidney disease:   Parkinson's Disease:   parksons:   By-pass surgery:   Arthritis:   Knee Surgery - Right:   Prostate laser surgery:   Colon Resection:   Knee Surgery - Left:   Cardiac Surgery:   Past Medical/ Surgical Hx:   Past Medical History as above    Past Surgical History as above, CABG 20 years ago   Home Medications: Medication Instructions Last Modified Date/Time  Plavix 75 mg oral tablet 1 tab(s) orally once a day 14-Apr-13 10:53  Lipitor 10 mg oral tablet 1 tab(s) orally once a day  (at bedtime) 14-Apr-13 10:53  verapamil 180 mg/24 hours oral capsule, extended release 1 cap(s) orally once a day 14-Apr-13 10:53  carbidopa-levodopa 25 mg-100 mg oral tablet 0.5 tab(s) orally 3 times a day 14-Apr-13 10:53  Nitrostat 0.4 mg 1 tab(s) sublingual every 5 minutes for 3 doses,for Chest Pain 14-Apr-13 10:53  nystatin-triamcinolone 100,000 units/g-0.1% topical cream 1 application topically once a day, As Needed for rash 14-Apr-13 10:53  tramadol 50 mg oral tablet 1 tab(s) orally every 6 hours as needed for pain 14-Apr-13 10:53  Vitamin D2 50,000 intl units (1.25 mg) oral capsule cap(s) orally once a month- due April 17th 14-Apr-13 10:51  Aloe Vera skin conditioner  lotion -1 application topically 2 times a day, As Needed- for Pain to face and shinges areas 14-Apr-13 10:53  Imodium A-D 2 mg oral tablet tab(s) orally 3 times a day, As Needed- for Diarrhea  18-Apr-13 12:36  Flomax 0.4 mg oral capsule 1 cap(s) orally once a day  14-Apr-13 10:53  predniSONE 10 mg oral tablet 1 tab(s) orally once a day  14-Apr-13 10:53  methotrexate 7.5 mg oral tablet 1 tab(s) orally  every Saturday 14-Apr-13 10:53  Levothroid 50 mcg (0.05 mg) oral tablet 1 tab(s) orally once a day  14-Apr-13 10:53  acetaminophen 325 mg oral tablet 2 tab(s) orally every 4 hours  as needed   14-Apr-13 10:53  pantoprazole 40 mg oral enteric coated tablet 1 tab(s) orally once a day  [id number] 84:66  folic acid 1 mg oral tablet 1 tab(s) orally once a day  14-Apr-13 10:53  multivitamin 1 tab(s)  once a day  14-Apr-13 10:53   Allergies:  Cogentin: Seizures  Social/Family History:  Employment Status: disabled   Lives With: alone   Living Arrangements: assisted living   Social History: no tob, no ETOH, no illicits   Family History: n/c   Vital Signs: **Vital Signs.:   19-Jun-13 12:27   Vital Signs Type Routine   Temperature Temperature (F) 97.5   Celsius 36.3   Temperature Source Oral   Pulse Pulse 73   Pulse source  per vital sign device   Respirations Respirations 18   Systolic BP Systolic BP 599   Diastolic BP (mmHg) Diastolic BP (mmHg) 71   Mean BP 107   BP Source vital sign device   Pulse Ox % Pulse Ox % 92   Pulse Ox Activity Level  At rest   Oxygen Delivery Room Air/ 21 %   Physical Exam:  General: alert, no acute distress, normal weight   HEENT: normocephalic, sclera nonicteric, oropharynx clear   Neck: supple, no JVD, no bruits   Chest: CTA B, no wheezes, good movement   Cardiac: RRR, 3/6 SE murmurs,  2+ pulses   Extremities: no C/C/E, limited ROM secondary to arthritis   Neurologic Exam:  Mental Status: alert and oriented x 2 not time, normal speech and language (naming and repetition), follows simple commands   Cranial Nerves: PERRLA, EOMI, R homonymous hemianopsia, face symmetric, tongue midline, shoulder shrug equal   Motor Exam: limited movement secondary to arthritis, mildly increased tone, no tremor   Deep Tendon Reflexes: 1+/4 B, mute plantars   Sensory Exam: pinprick, temperature, but vibration decreased in B LE   Coordination: FTNnormal, HTS untestable   Lab Results: Thyroid:  18-Jun-13 18:39    Thyroid Stimulating Hormone 2.07 (0.45-4.50 (International Unit)  ----------------------- Pregnant patients have  different reference  ranges for TSH:  - - - - - - - - - -  Pregnant, first trimetser:  0.36 - 2.50 uIU/mL)  Hepatic:  18-Jun-13 18:39    Bilirubin, Total 0.7   Alkaline Phosphatase 59   SGPT (ALT) 22 (12-78 NOTE: NEW REFERENCE RANGE 03/12/2011)   SGOT (AST) 27   Total Protein, Serum 7.4   Albumin, Serum 3.6  Routine Chem:  19-Jun-13 04:33    Cholesterol, Serum 179   Triglycerides, Serum 127   HDL (INHOUSE)  74   VLDL Cholesterol Calculated 25   LDL Cholesterol Calculated 80 (Result(s) reported on 06 Oct 2011 at 05:22AM.)   Glucose, Serum 79   BUN  25   Creatinine (comp) 1.16   Sodium, Serum 140   Potassium, Serum 3.7   Chloride, Serum 106   CO2,  Serum 24   Calcium (Total), Serum 8.5   Anion Gap 10   Osmolality (calc) 283   eGFR (African American) >60   eGFR (Non-African American)  58 (eGFR values <55m/min/1.73 m2 may be an indication of chronic kidney disease (CKD). Calculated eGFR is useful in patients with stable renal function. The eGFR calculation will not be reliable in acutely ill patients when serum creatinine is changing rapidly. It is not useful in  patients on dialysis. The eGFR calculation may not be applicable to patients at the low and high extremes of body sizes, pregnant  women, and vegetarians.)  Cardiac:  19-Jun-13 04:33    Troponin I 0.02 (0.00-0.05 0.05 ng/mL or less: NEGATIVE  Repeat testing in 3-6 hrs  if clinically indicated. >0.05 ng/mL: POTENTIAL  MYOCARDIAL INJURY. Repeat  testing in 3-6 hrs if  clinically indicated. NOTE: An increase or decrease  of 30% or more on serial  testing suggests a  clinically important change)  Routine Coag:  18-Jun-13 18:39    Activated PTT (APTT) 30.9 (A HCT value >55% may artifactually increase the APTT. In one study, the increase was an average of 19%. Reference: "Effect on Routine and Special Coagulation Testing Values of Citrate Anticoagulant Adjustment in Patients with High HCT Values." American Journal of Clinical Pathology 2006;126:400-405.)   Prothrombin 12.0   INR 0.9 (INR reference interval applies to patients on anticoagulant therapy. A single INR therapeutic range for coumarins is not optimal for all indications; however, the suggested range for most indications is 2.0 - 3.0. Exceptions to the INR Reference Range may include: Prosthetic heart valves, acute myocardial infarction, prevention of myocardial infarction, and combinations of aspirin and anticoagulant. The need for a higher or lower target INR must be assessed individually. Reference: The Pharmacology and Management of the Vitamin K  antagonists: the seventh ACCP Conference on  Antithrombotic and Thrombolytic Therapy. QQIWL.7989 Sept:126 (3suppl): [id number]. A HCT value >55% may artifactually increase the PT.  In one study,  the increase was an average of 25%. Reference:  "Effect on Routine and Special Coagulation Testing Values of Citrate Anticoagulant Adjustment in Patients with High HCT Values." American Journal of Clinical Pathology 2006;126:400-405.)  Routine Hem:  19-Jun-13 04:33    WBC (CBC) 8.7   RBC (CBC)  3.73   Hemoglobin (CBC)  11.9   Hematocrit (CBC)  36.5   Platelet Count (CBC) 214   MCV 98   MCH 31.8   MCHC 32.5   RDW  14.8   Neutrophil % 69.1   Lymphocyte % 17.7   Monocyte % 12.0   Eosinophil % 0.7   Basophil % 0.5   Neutrophil # 6.0   Lymphocyte # 1.5   Monocyte #  1.1   Eosinophil # 0.1   Basophil # 0.0 (Result(s) reported on 06 Oct 2011 at 05:22AM.)   Radiology Results: [place]:    19-Jun-13 10:17, US Carotid Doppler Bilateral   US Carotid Doppler Bilateral    REASON FOR EXAM:    cva  COMMENTS:       PROCEDURE: [place]  - US CAROTID DOPPLER BILATERAL  - Oct 06 2011 10:17AM     RESULT: There is mild to moderate calcific plaque formation observed   about the carotid bifurcations bilaterally. On the right, the peak right   common carotid artery flow velocity measures 71 cm/second and the peak   right internal carotid artery flow velocity measurement of 111.3   cm/second. The ICA/CCA ratio is 1.57. On the left, the peak left common   carotid artery flow velocity measures 57.5 cm/second and the peak left   internal carotid artery flow velocity measures 56.9 cm/second. ICA/CCA   ratio is 0.99. These values bilaterally are consistent with the absence   of hemodynamically significant stenosis.    There is observed antegrade flow in both vertebrals.    IMPRESSION:   1. No hemodynamically significant stenosis is identified on either side.  2. There is observed antegrade flow in both vertebrals.    Thank you for the opportunity to  contribute to the care of your patient.  Dictation site: 2           Verified By: Dionne Ano WALL, M.D., MD  MRI:    19-Jun-13 14:17, MRI Brain Without Contrast   MRI Brain Without Contrast    REASON FOR EXAM:    cva  COMMENTS:       PROCEDURE: MR  - MR BRAIN WO CONTRAST  - Oct 06 2011  2:17PM     RESULT:     HISTORY:  CVA.    COMPARISON STUDY:    Head CT of 10/05/2011 and MRI of the brain of   06/24/2010.    FINDINGS:  Multiplanar, multisequence imaging of the brain is obtained.   Diffusion-weighted images reveal acute infarct in the right occipital   lobe. This is a new finding from prior MRI of 06/24/2010. Diffuse cerebral     atrophy and white matter changes consistent with chronic ischemia noted.   Vascular flow voids are normal.    IMPRESSION:      Acute infarct right occipital lobe.    Thank you for the opportunity to contribute to the care of your patient.           Verified By: Osa Craver, M.D., MD  CT:    18-Jun-13 19:15, CT Head Without Contrast   CT Head Without Contrast    REASON FOR EXAM:    cva sx  COMMENTS:       PROCEDURE: CT  - CT HEAD WITHOUT CONTRAST  - Oct 05 2011  7:15PM     RESULT: Comparison:  None    Technique: Multiple axial images from the foramen magnum to the vertex   were obtained without IV contrast.    Findings:      There is no evidence of mass effect, midline shift, or extra-axial fluid   collections.  There is no evidence of a space-occupying lesion or   intracranial hemorrhage. There is an old left right occipital infarct     with encephalomalacia. There is a subacute right parietal infarct. There   is generalized cerebral atrophy. There is periventricular white matter   low attenuation likely secondary to microangiopathy.    The ventricles and sulci are appropriate for the patient's age.The basal   cisterns are patent.    Visualized portions of the orbits are unremarkable. The visualized   portions of the  paranasal sinuses and mastoid air cells are unremarkable.   Cerebrovascular atherosclerotic calcifications are noted.    The osseous structures are unremarkable.    IMPRESSION:      Subacute nonhemorrhagic right parietal lobe infarct. CT can underestimate   ischemia in the first 24 hours after the event. If there is clinical   concern for an acute infarct, a followup MRI or repeat CT scan in 24   hours may provide additional information.        Dictation Site: 1          Verified By: Jennette Banker, M.D., MD   Impression/Recommendations:  Recommendations:   labs unremarkable of brain personally reviewed by me and shows an acute R occipital infarct, severe white matter disease with atrophy d/w referring physician    Acute R occipital infarct-  etiology appears to be cardioembolic in nature by imaging; however his symptoms do not coorelate with this lesion. R homonymous hemianopsia-  not cause by 1. these could be mild epileptic phenomena Dementia-  by MRI, he could easily have vascular dementia due to diffuse white matter changes Parkinsonism-  most likely secondary to diffuse white matter changes, stable;  would not adjust meds because pt is bedbound already from rheumatoid arthritis Rheumatoid arthritis-  on MTX and steroids, limits pts movement echo pending;  if neg, should get Holter monitor for several weeks add ASA 27m daily continue plavix and statin at current dose  check B12/folate, TSH, Hem A1c EEG tomorrow may benefit from Aricept at some time will follow  Electronic Signatures: SJamison Neighbor(MD)  (Signed 19-Jun-13 15:27)  Authored: REFERRING PHYSICIAN, Primary Care Physician, Consult, History of Present Illness, Review of Systems, PAST MEDICAL/SURGICAL HISTORY, HOME MEDICATIONS, ALLERGIES, Social/Family History, NURSING VITAL SIGNS, Physical Exam-, LAB RESULTS, RADIOLOGY RESULTS, Recommendations   Last Updated: 19-Jun-13 15:27 by SJamison Neighbor(MD)

## 2014-08-11 NOTE — H&P (Signed)
PATIENT NAME:  [name], [name] MR#:  [id number] DATE OF BIRTH:  1924-11-05  DATE OF ADMISSION:  08/01/2011  REFERRING PHYSICIAN: Dr. Bayard Malesandolph Brown  PRIMARY CARE PHYSICIAN: Dr. Hyacinth MeekerMiller   CHIEF COMPLAINT: Confusion, diarrhea, vomiting.   HISTORY OF PRESENT ILLNESS: Mr. [name] is an 81 year old male with history of rheumatoid arthritis, coronary artery disease status post CABG, CVA, chronic kidney disease. Parkinson's who is a nursing home resident presents today with his son due to complaints of diarrhea and vomiting. Son is at bedside, gives most of the history as patient is sleepy and lethargic and more confused than his baseline. Son reports this afternoon patient developed diarrhea, multiple episodes, watery, foul smelling, as well had episode of vomiting at the nursing home as well in ED. Patient reports as per nursing home staff they were a couple of nursing home residents had these complaints yesterday as well. Patient's baseline is confused, demented but son reporting he has been noticing worsening mental status for his father. In ED patient was found to have elevated BUN and creatinine and to be clinically dehydrated so admission was requested by hospitalist service for hydration.   PAST MEDICAL HISTORY: 1. Rheumatoid arthritis.  2. Coronary artery disease status post bypass. 3. History of Mobitz type 2 heart block from beta blocker.  4. Cerebrovascular accident. Son reported no residual deficits.  5. Chronic kidney disease, baseline approximately 1.4.  6. Parkinson's disease.  7. Vascular dementia.  8. Hypertension.   PAST SURGICAL HISTORY: 1. Bypass. 2. Lens implant.  3. Bilateral knee replacement.   ALLERGIES: Cogentin.   MEDICATIONS:  1. Tylenol 650 every four hours as needed.  2. Carbidopa, levodopa 25/100, 1 tablet 3 times a day. 3. Flomax 0.5 mg daily.  4. Folic acid 1 mg daily. 5. Levothroid 50 mcg daily. 6. Lipitor 10 mg at bedtime.  7. Methotrexate 7.5 mg every  Saturday.  8. Multivitamin 1 tablet daily.  9. Nitrostat as needed.  10. Nystatin 100,000 units oral suspension as needed.  11. Pantoprazole 40 mg daily. 12. Plavix 75 mg daily.  13. Prednisone 10 mg daily.  14. Tramadol 50 mg tablet as needed. 15. Verapamil 150 capsule extended release.  16. Vitamin D2 50,000 units weekly.   SOCIAL HISTORY: Patient is a nursing home resident. No tobacco, alcohol or drug use. He was previously an Art gallery managerengineer.   FAMILY HISTORY: Brother has history of heart disease.   REVIEW OF SYSTEMS: Patient is currently sleeping. Responds to verbal stimuli, wakes up but does not answer questions appropriately so review of systems could not be obtained at this point.   PHYSICAL EXAMINATION: VITAL SIGNS: Temperature 98.3, T-max 101.3, pulse 86, respiratory rate 16, blood pressure 177/76, saturating 95% on room air.  GENERAL: Elderly male in no apparent distress.   HEENT: Head atraumatic, normocephalic. Pupils equal, reactive to light. Pink conjunctiva. Anicteric sclera. Moist oral mucosa.   NECK: Supple. No thyromegaly. No JVD.   CHEST: Good air entry bilaterally. No wheezing, rales, rhonchi.   CARDIOVASCULAR: S1, S2 heard. No rubs, murmur, gallops.   ABDOMEN: Soft, nontender, nondistended. Bowel sounds present.   EXTREMITIES: No edema, no clubbing, no cyanosis.  MOTOR: Nonfocal.   LABORATORY, DIAGNOSTIC AND RADIOLOGICAL DATA: Glucose 148, BUN 45, creatinine 1.55, sodium 143, potassium 3.9, chloride 109, CO2 24, alkaline phosphatase 53, AST 25, ALT 20, white blood cell 15.7, hemoglobin 13.6, hematocrit 42.2, platelet 239. Urinalysis negative.   ASSESSMENT AND PLAN:  1. Nausea, vomiting, diarrhea. This is most likely secondary to  gastroenteritis and possible enteritis. Most likely cause is viral as it seems to be going in multiple patients at the nursing home. Will check [place] virus PCR, as well will check stool work-up, C. difficile, ova and parasite and white  blood cells and the fact the patient does not appear toxic at this point will refrain from starting antibiotic especially with thinking the origin is viral.  2. Sepsis. Patient is febrile with leukocytosis. This is secondary to reason #1. Will obtain blood culture and urinalysis is negative.  3. Acute on chronic renal failure.  This is secondary to dehydration. Will continue with IV normal saline and monitor BMP closely.  4. Parkinson's disease. Continue with Sinemet.  5. Hypertension. Will continue with verapamil.  6. Hypothyroidism. Continue with Levothroid.  7. Hyperlipidemia. Continue Lipitor.  8. Rheumatoid arthritis. Continue with methotrexate and prednisone.  9. Hypodense structure in the pancreas finding in the CT abdomen. Discussed with son and at this age and point does not wish any further work-up or investigation for this finding.  10. CODE STATUS: DO NOT RESUSCITATE/DO NOT INTUBATE.   TIME SPENT: Approximately 55 minutes on patient care.  ____________________________ Starleen Arms, MD dse:cms D: 08/01/2011 05:46:49 ET T: 08/01/2011 08:46:53 ET  JOB#: [id number] cc: Danella Penton, MD Rejoice Heatwole Teena Irani MD ELECTRONICALLY SIGNED 08/02/2011 0:13

## 2014-08-11 NOTE — Discharge Summary (Signed)
PATIENT NAME:  [name], Pietro M MR#:  [id number] DATE OF BIRTH:  1924/09/17  DATE OF ADMISSION:  10/05/2011 DATE OF DISCHARGE:  10/12/2011  ADDENDUM:   The patient was with some volume depletion and renal insufficiency, treated with IV fluids. His mentation improved. His diet was changed to mechanical soft. He was more alert, more responsive, still agitated at times requiring Seroquel medication. Overall prognosis is poor with his multiple medical problems and progressive Parkinson's disease. [facility].    ____________________________ Danella PentonMark F. Lewanda Perea, MD mfm:ap D: 10/12/2011 07:44:36 ET T: 10/12/2011 08:26:13 ET JOB#: [id number]  cc: Danella PentonMark F. Kyrstyn Greear, MD, <Dictator> Jeanpierre Thebeau Sherlene ShamsF Fredricka Kohrs MD ELECTRONICALLY SIGNED 10/16/2011 9:18

## 2014-08-11 NOTE — Discharge Summary (Signed)
PATIENT NAME:  [name], [name] MR#:  [id number] DATE OF BIRTH:  1924-05-24  DATE OF ADMISSION:  10/05/2011 DATE OF DISCHARGE:  10/09/2011  FINAL DIAGNOSES:  1. Acute right occipital lobe cerebral infarction.  2. Rheumatoid arthritis.  3. Coronary artery disease.  4. History of prior cerebrovascular events with likely vascular dementia.  5. Chronic kidney disease, stage III.  6. Parkinson's disease.  7. Hypertension.  8. History of coronary artery bypass.  9. History of cataract repairs.  10. History of bilateral knee replacements.   HISTORY AND PHYSICAL: Please see dictated admission history and physical.   HOSPITAL COURSE: The patient was admitted with altered mental status, beyond his baseline, and appeared to be having some difficulty seeing to the right side. His mobility has been poor at baseline, and so it was difficult to determine whether he had developed new neurologic deficits otherwise. He underwent CT scan in the emergency room which revealed probable subacute cerebrovascular accident. He subsequently underwent MRI which confirmed the finding of acute right occipital cerebral vascular infarction. Neurology saw the patient. They worried about an embolic event, however, carotid Dopplers were negative, echocardiogram showed no source for clot, and he did not have any evidence of atrial fibrillation or other significant dysrhythmias during his hospitalization. He had been on Plavix and aspirin was added to this with the risks of this regimen discussed with the patient's family members. His mental status remained somewhat poor, but gradually improved. He did have some agitation at night, however, this improved as well. He was evaluated by physical therapy, occupational therapy, and speech therapy, and the plan was for him to go for skilled nursing to try to see if he can improve his mobility further.  He had been chronically on Sinemet 25/100 mg, 1/2 tablet three times a day for his  Parkinson's. Neurology's recommendation was to hold this due to sedation, however, if he appears to be having worsening mobility that would be consistent with Parkinson's, these medications may need to be reintroduced.   At this point, the patient will be discharged to a skilled nursing facility in stable condition with his physical activity to be up with assistance with a walker as tolerated. His diet should be no added salts. PT, OT, and speech therapy should evaluate and treat the patient. He should followup with the nursing home physician. There was recommendation that he have a Holter monitor placed for up to 3 to 4 weeks, however, this patient cannot use an event monitor, therefore recommend a Holter monitor in the next 48 hours to see whether atrial fibrillation does develop, which would warrant changing his anticoagulation.   DISCHARGE MEDICATIONS:  1. Plavix 75 mg p.o. at daily. 2. Lipitor 10 mg p.o. at bedtime. 3. Verapamil 180 mg p.o. at bedtime.  4. Flomax 0.4 mg p.o. at bedtime.  5. Prednisone 10 mg p.o. daily.  6. Methotrexate 7.5 mg p.o. every week.  7. Levothyroxine 0.05 mg p.o. daily.  8. Pantoprazole 40 mg p.o. daily. 9. Folate 1 mg p.o. daily.  10. Multivitamin 1 p.o. daily.  11. Enteric-coated aspirin 81 mg p.o. daily.  12. Losartan 25 mg p.o. daily.  13. Seroquel 25 mg p.o. every six hours p.r.n. agitation.  14. Vitamin D 50,000 units p.o. every month for vitamin D deficiency.   CODE STATUS: THE PATIENT IS DO NOT RESUSCITATE.  ____________________________ Lynnea Ferrier, MD bjk:slb D: 10/08/2011 07:15:00 ET T: 10/08/2011 07:40:48 ET JOB#: [id number]  cc: Lynnea Ferrier, MD, <  Dictator> Daniel NonesBERT KLEIN MD ELECTRONICALLY SIGNED 10/14/2011 8:56
